# Patient Record
Sex: Female | Born: 1985 | Race: White | Hispanic: No | Marital: Married | Smoking: Former smoker
Health system: Southern US, Community
[De-identification: ages and names within clinical notes are randomized; demographics above are authoritative.]

## PROBLEM LIST (undated history)

## (undated) DIAGNOSIS — K589 Irritable bowel syndrome without diarrhea: Secondary | ICD-10-CM

## (undated) DIAGNOSIS — E669 Obesity, unspecified: Secondary | ICD-10-CM

## (undated) DIAGNOSIS — K219 Gastro-esophageal reflux disease without esophagitis: Secondary | ICD-10-CM

## (undated) DIAGNOSIS — A048 Other specified bacterial intestinal infections: Secondary | ICD-10-CM

## (undated) DIAGNOSIS — I10 Essential (primary) hypertension: Secondary | ICD-10-CM

## (undated) DIAGNOSIS — N200 Calculus of kidney: Secondary | ICD-10-CM

## (undated) DIAGNOSIS — R51 Headache: Secondary | ICD-10-CM

## (undated) DIAGNOSIS — F419 Anxiety disorder, unspecified: Secondary | ICD-10-CM

## (undated) DIAGNOSIS — Z8489 Family history of other specified conditions: Secondary | ICD-10-CM

## (undated) DIAGNOSIS — T07XXXA Unspecified multiple injuries, initial encounter: Secondary | ICD-10-CM

## (undated) DIAGNOSIS — M797 Fibromyalgia: Secondary | ICD-10-CM

## (undated) DIAGNOSIS — R55 Syncope and collapse: Secondary | ICD-10-CM

## (undated) DIAGNOSIS — R519 Headache, unspecified: Secondary | ICD-10-CM

## (undated) DIAGNOSIS — E039 Hypothyroidism, unspecified: Secondary | ICD-10-CM

## (undated) DIAGNOSIS — R112 Nausea with vomiting, unspecified: Secondary | ICD-10-CM

## (undated) DIAGNOSIS — Z9289 Personal history of other medical treatment: Secondary | ICD-10-CM

## (undated) DIAGNOSIS — T148XXA Other injury of unspecified body region, initial encounter: Secondary | ICD-10-CM

## (undated) DIAGNOSIS — G8929 Other chronic pain: Secondary | ICD-10-CM

## (undated) DIAGNOSIS — K2210 Ulcer of esophagus without bleeding: Secondary | ICD-10-CM

## (undated) DIAGNOSIS — M1990 Unspecified osteoarthritis, unspecified site: Secondary | ICD-10-CM

## (undated) DIAGNOSIS — Z9889 Other specified postprocedural states: Secondary | ICD-10-CM

## (undated) HISTORY — DX: Anxiety disorder, unspecified: F41.9

## (undated) HISTORY — DX: Headache: R51

## (undated) HISTORY — PX: TONSILLECTOMY: SUR1361

## (undated) HISTORY — DX: Ulcer of esophagus without bleeding: K22.10

## (undated) HISTORY — DX: Essential (primary) hypertension: I10

## (undated) HISTORY — DX: Other specified bacterial intestinal infections: A04.8

## (undated) HISTORY — DX: Fibromyalgia: M79.7

## (undated) HISTORY — DX: Hypothyroidism, unspecified: E03.9

## (undated) HISTORY — DX: Calculus of kidney: N20.0

## (undated) HISTORY — DX: Headache, unspecified: R51.9

## (undated) HISTORY — DX: Obesity, unspecified: E66.9

## (undated) HISTORY — DX: Gastro-esophageal reflux disease without esophagitis: K21.9

## (undated) HISTORY — DX: Unspecified osteoarthritis, unspecified site: M19.90

## (undated) HISTORY — DX: Irritable bowel syndrome, unspecified: K58.9

## (undated) HISTORY — DX: Other chronic pain: G89.29

---

## 2011-05-18 ENCOUNTER — Encounter: Payer: Self-pay | Admitting: Gastroenterology

## 2011-06-06 ENCOUNTER — Other Ambulatory Visit (INDEPENDENT_AMBULATORY_CARE_PROVIDER_SITE_OTHER): Payer: Medicaid Other

## 2011-06-06 ENCOUNTER — Encounter: Payer: Self-pay | Admitting: Gastroenterology

## 2011-06-06 ENCOUNTER — Ambulatory Visit (INDEPENDENT_AMBULATORY_CARE_PROVIDER_SITE_OTHER): Payer: Medicaid Other | Admitting: Gastroenterology

## 2011-06-06 VITALS — BP 126/86 | HR 96 | Ht 67.0 in | Wt 299.0 lb

## 2011-06-06 DIAGNOSIS — K589 Irritable bowel syndrome without diarrhea: Secondary | ICD-10-CM

## 2011-06-06 DIAGNOSIS — R1012 Left upper quadrant pain: Secondary | ICD-10-CM

## 2011-06-06 LAB — HEPATIC FUNCTION PANEL
Alkaline Phosphatase: 63 U/L (ref 39–117)
Bilirubin, Direct: 0 mg/dL (ref 0.0–0.3)
Total Bilirubin: 0.3 mg/dL (ref 0.3–1.2)
Total Protein: 7.1 g/dL (ref 6.0–8.3)

## 2011-06-06 LAB — CBC WITH DIFFERENTIAL/PLATELET
Basophils Relative: 0.4 % (ref 0.0–3.0)
Eosinophils Absolute: 0.1 10*3/uL (ref 0.0–0.7)
Lymphocytes Relative: 24.5 % (ref 12.0–46.0)
MCHC: 34.3 g/dL (ref 30.0–36.0)
MCV: 90.7 fl (ref 78.0–100.0)
Monocytes Absolute: 0.3 10*3/uL (ref 0.1–1.0)
Neutrophils Relative %: 69 % (ref 43.0–77.0)
Platelets: 241 10*3/uL (ref 150.0–400.0)
RBC: 4.34 Mil/uL (ref 3.87–5.11)
WBC: 5.7 10*3/uL (ref 4.5–10.5)

## 2011-06-06 MED ORDER — GLYCOPYRROLATE 2 MG PO TABS: 2.0000 mg | ORAL_TABLET | Freq: Two times a day (BID) | ORAL | Status: DC

## 2011-06-06 NOTE — Progress Notes (Signed)
History of Present Illness: This is a 25 year old female here today with her mother who has multiple complaints. She relates a history of GERD with "ulcers in her esophagus" diagnosed 3 years ago in [place]. She's been taking ranitidine 150 mg twice a day to 300 mg twice a day for the past few years to control her symptoms. Recently her reflux symptoms, mainly described as midsternal chest burning, worsened and she was switched to Nexium which has improved control of her symptoms. She was found to have an elevated H. pylori antibody last month and she was treated with Prevpac. She moves to this area from [place] in August and since that time she has had problems with recurrent left-sided abdominal pain associated with urgent loose nonbloody bowel movements. Denies weight loss, constipation,change in stool caliber, melena, hematochezia, nausea, vomiting, dysphagia.  Past Medical History  Diagnosis Date  . Anxiety   . Gout   . GERD (gastroesophageal reflux disease)   . H. pylori infection   . IBS (irritable bowel syndrome)   . Arthritis   . Chronic headaches   . Gastric ulcer   . HTN (hypertension)   . Hypothyroidism   . Kidney stones   . Obesity    Past Surgical History  Procedure Date  . Cesarean section   . Tonsillectomy     reports that she has been smoking Cigarettes.  She has been smoking about .3 packs per day. She has never used smokeless tobacco. She reports that she drinks alcohol. She reports that she does not use illicit drugs. family history includes Brain cancer (age of onset:50) in her maternal aunt; COPD in her maternal uncle; Cancer in an unspecified family member; Crohn's disease in her cousin; Dementia in her paternal grandmother; Diverticulosis in her mother; Emphysema in her maternal grandfather and maternal uncle; Heart disease in her maternal grandmother; Irritable bowel syndrome in her cousin; Thyroid disease in an unspecified family member; and Ulcerative colitis in her  cousin. No Known Allergies  Outpatient Encounter Prescriptions as of 06/06/2011  Medication Sig Dispense Refill  . ALPRAZolam (XANAX) 0.25 MG tablet Take 0.25 mg by mouth daily.        Marland Kitchen esomeprazole (NEXIUM) 40 MG capsule Take 40 mg by mouth daily before breakfast.        . etodolac (LODINE) 400 MG tablet Take 400 mg by mouth 2 (two) times daily.        . febuxostat (ULORIC) 40 MG tablet Take 80 mg by mouth daily.        Marland Kitchen levothyroxine (SYNTHROID, LEVOTHROID) 100 MCG tablet Take 100 mcg by mouth daily.        Kathrynn Running Estrad-Fe Biphas (LO LOESTRIN FE PO) Take 1 tablet by mouth daily.        . phentermine (ADIPEX-P) 37.5 MG tablet Take 37.5 mg by mouth daily before breakfast.        . triamterene-hydrochlorothiazide (DYAZIDE) 37.5-25 MG per capsule Take 1 capsule by mouth every morning.        Marland Kitchen glycopyrrolate (ROBINUL-FORTE) 2 MG tablet Take 1 tablet (2 mg total) by mouth 2 (two) times daily before lunch and supper.  60 tablet  11   Review of Systems: Pertinent positive and negative review of systems were noted in the above HPI section. All other review of systems were otherwise negative.   Physical Exam: General: Well developed , well nourished, no acute distress Head: Normocephalic and atraumatic Eyes:  sclerae anicteric, EOMI Ears: Normal auditory acuity Mouth: No  deformity or lesions Neck: Supple, no masses or thyromegaly Lungs: Clear throughout to auscultation Heart: Regular rate and rhythm; no murmurs, rubs or bruits Abdomen: Soft, mild to moderate left-sided abdominal tenderness without rebound or guarding and non distended. No masses, hepatosplenomegaly or hernias noted. Normal Bowel sounds Musculoskeletal: Symmetrical with no gross deformities  Skin: No lesions on visible extremities Pulses:  Normal pulses noted Extremities: No clubbing, cyanosis, edema or deformities noted Neurological: Alert oriented x 4, grossly nonfocal Cervical Nodes:  No significant cervical  adenopathy Inguinal Nodes: No significant inguinal adenopathy Psychological:  Alert and cooperative. Very anxious.  Assessment and Recommendations:  1. GERD with a history of esophagitis. Standard antireflux measures and a daily PPI. Continue Nexium 40 mg daily for now. If her symptoms are not well controlled consider endoscopy.  2. Left-sided abdominal pain and urgent diarrhea. Presumed irritable bowel syndrome. Begin glycopyrrolate 2 mg twice daily and standard dietary instructions for irritable bowel syndrome. Her anxiety is likely triggering irritable bowel and this needs to be further addressed by her primary physician. Minimize or avoid ASA, NSAIDs, cox-2 agents. Proceed with a CT scan of the abdomen and pelvis to rule out other disorders leading to abdominal pain. Obtain stool Hemoccults. Standard blood work. Consider colonoscopy and upper endoscopy symptoms do not respond.

## 2011-06-06 NOTE — Patient Instructions (Addendum)
You have been given a separate informational sheet regarding your tobacco use, the importance of quitting and local resources to help you quit.  Go directly to the basement to have your labs drawn today.  You have been scheduled for a CT scan of the abdomen and pelvis at [facility] ([address]---this is in the same building as Architectural [facility]).   You are scheduled on 06/12/11 at 1:00pm. You should arrive 15 minutes prior to your appointment time for registration. Please follow the written instructions below on the day of your exam:  WARNING: IF YOU ARE ALLERGIC TO IODINE/X-RAY DYE, PLEASE NOTIFY RADIOLOGY IMMEDIATELY AT [phone]! YOU WILL BE GIVEN A 13 HOUR PREMEDICATION PREP.  1) Do not eat or drink anything after 9:00am (4 hours prior to your test) 2) You have been given 2 bottles of oral contrast to drink. The solution may taste better if refrigerated, but do NOT add ice or any other liquid to this solution. Shake well before drinking.    Drink 1 bottle of contrast @ 11:00am (2 hours prior to your exam)  Drink 1 bottle of contrast @ 12:00pm (1 hour prior to your exam)  You may take any medications as prescribed with a small amount of water except for the following: Metformin, Glucophage, Glucovance, Avandamet, Riomet, Fortamet, Actoplus Met, Janumet, Glumetza or Metaglip. The above medications must be held the day of the exam AND 48 hours after the exam.  The purpose of you drinking the oral contrast is to aid in the visualization of your intestinal tract. The contrast solution may cause some diarrhea. Before your exam is started, you will be given a small amount of fluid to drink. Depending on your individual set of symptoms, you may also receive an intravenous injection of x-ray contrast/dye. Plan on being at [facility] for 30 minutes or long, depending on the type of exam you are having performed.  If you have any questions regarding your exam or if you  need to reschedule, you may call the CT department at [phone] between the hours of 8:00 am and 5:00 pm, Monday-Friday.  ________________________________________________________________________  Follow instructions on Hemoccult cards and mail back to [place] when finished.  Your prescription for Robinul forte has been sent to your pharmacy.  IBS brochure given. Patient advised to avoid spicy, acidic, citrus, chocolate, mints, fruit and fruit juices.  Limit the intake of caffeine, alcohol and Soda.  Don't exercise too soon after eating.  Don't lie down within 3-4 hours of eating.  Elevate the head of your bed.  cc: Rudi Heap, MD

## 2011-06-12 ENCOUNTER — Inpatient Hospital Stay: Admission: RE | Admit: 2011-06-12 | Payer: Medicaid Other | Source: Ambulatory Visit

## 2011-06-13 ENCOUNTER — Telehealth: Payer: Self-pay

## 2011-06-13 NOTE — Telephone Encounter (Signed)
Called patient to notify her that her CT scan was not approved by [employer]. Per Dr. Ardell Isaacs last office note the next step would be to consider scheduling Endoscopy/ Colonoscopy. Spoke with Dr. Russella Dar and he states if her symptoms are no better since the last office visit and taking the anti-spasmodic then to proceed with Endo/ Colon with propofol. Spoke with patient and she states the glycopyrrolate has not helped with her abdominal pain or diarrhea. Her symptoms are the same if not worse and would like to schedule both procedures. Scheduled patient for ECL on 07/23/10 at 9:30am and previsit on 07/17/10 at 10:00am. Told patient that if she wants to call back in a week there could be opening slot sooner for her procedures due to cancellations.  Pt agreed and verbalized understanding.

## 2011-06-15 ENCOUNTER — Other Ambulatory Visit: Payer: Medicaid Other

## 2011-06-18 ENCOUNTER — Telehealth: Payer: Self-pay | Admitting: Gastroenterology

## 2011-06-18 NOTE — Telephone Encounter (Signed)
Patient advised that she can call and look for earlier openings.  She is upset that we won't do CT scan.  I have explained again that we are more than happy to have her do CT, but her insurance denied it.  She is advised that we can reschedule CT scan but she will be responsible for the bill.  She does not want to reschedule.  She will continue to call and check for cancellations

## 2011-06-21 ENCOUNTER — Other Ambulatory Visit: Payer: Medicaid Other

## 2011-06-21 ENCOUNTER — Other Ambulatory Visit: Payer: Self-pay | Admitting: Gastroenterology

## 2011-06-21 DIAGNOSIS — Z1211 Encounter for screening for malignant neoplasm of colon: Secondary | ICD-10-CM

## 2011-06-21 LAB — HEMOCCULT SLIDES (X 3 CARDS)
Fecal Occult Blood: NEGATIVE
OCCULT 1: NEGATIVE
OCCULT 2: NEGATIVE
OCCULT 3: NEGATIVE
OCCULT 4: NEGATIVE
OCCULT 5: NEGATIVE

## 2011-06-29 ENCOUNTER — Encounter: Payer: Self-pay | Admitting: Gastroenterology

## 2011-06-29 NOTE — Telephone Encounter (Signed)
Error

## 2011-07-18 ENCOUNTER — Ambulatory Visit (AMBULATORY_SURGERY_CENTER): Payer: Medicaid Other | Admitting: *Deleted

## 2011-07-18 VITALS — Ht 67.0 in | Wt 281.4 lb

## 2011-07-18 DIAGNOSIS — K589 Irritable bowel syndrome without diarrhea: Secondary | ICD-10-CM

## 2011-07-18 DIAGNOSIS — R1012 Left upper quadrant pain: Secondary | ICD-10-CM

## 2011-07-18 MED ORDER — PEG-KCL-NACL-NASULF-NA ASC-C 100 G PO SOLR: ORAL | Status: DC

## 2011-07-24 ENCOUNTER — Ambulatory Visit (AMBULATORY_SURGERY_CENTER): Payer: Medicaid Other | Admitting: Gastroenterology

## 2011-07-24 ENCOUNTER — Encounter: Payer: Self-pay | Admitting: Gastroenterology

## 2011-07-24 DIAGNOSIS — K589 Irritable bowel syndrome without diarrhea: Secondary | ICD-10-CM

## 2011-07-24 DIAGNOSIS — R197 Diarrhea, unspecified: Secondary | ICD-10-CM

## 2011-07-24 DIAGNOSIS — R1012 Left upper quadrant pain: Secondary | ICD-10-CM

## 2011-07-24 DIAGNOSIS — K2980 Duodenitis without bleeding: Secondary | ICD-10-CM

## 2011-07-24 DIAGNOSIS — K219 Gastro-esophageal reflux disease without esophagitis: Secondary | ICD-10-CM

## 2011-07-24 MED ORDER — SODIUM CHLORIDE 0.9 % IV SOLN: 500.0000 mL | INTRAVENOUS | Status: DC

## 2011-07-24 MED ORDER — ESOMEPRAZOLE MAGNESIUM 40 MG PO CPDR: 40.0000 mg | DELAYED_RELEASE_CAPSULE | Freq: Two times a day (BID) | ORAL | Status: DC

## 2011-07-24 NOTE — Op Note (Signed)
[facility] [address] [place], [place]  [address]  COLONOSCOPY PROCEDURE REPORT  PATIENT:  Tasha, Hays  MR#:  [id number] BIRTHDATE:  [birth year], 25 yrs. old  GENDER:  female ENDOSCOPIST:  Judie Petit T. Russella Dar, MD, [facility] Referred by:  Rudi Heap, M.D. PROCEDURE DATE:  07/24/2011 PROCEDURE:  Colonoscopy with biopsy ASA CLASS:  Class II INDICATIONS:  1) unexplained diarrhea  2) abdominal pain MEDICATIONS:   MAC sedation, administered by CRNA, propofol (Diprivan) 500 mg IV DESCRIPTION OF PROCEDURE:   After the risks benefits and alternatives of the procedure were thoroughly explained, informed consent was obtained.  Digital rectal exam was performed and revealed no abnormalities.   The [id number] endoscope was introduced through the anus and advanced to the terminal ileum which was intubated for a short distance, without limitations. The quality of the prep was good, using MoviPrep.  The instrument was then slowly withdrawn as the colon was fully examined. <<PROCEDUREIMAGES>> FINDINGS:  Scattered diverticula were found in the transverse colon.  The terminal ileum appeared normal.  Otherwise normal colonoscopy without other polyps, masses, vascular ectasias, or inflammatory changes. Random biopsies were obtained and sent to pathology.   Retroflexed views in the rectum revealed no abnormalities.  The time to cecum =  3  minutes. The scope was then withdrawn (time =  11.25  min) from the patient and the procedure completed.  COMPLICATIONS:  None  ENDOSCOPIC IMPRESSION: 1) Diverticula, scattered in the transverse colon 2) Normal terminal ileum  RECOMMENDATIONS: 1) Await pathology results 2) Continue treatment for IBS with probiotics and antispasmotics   [name] T. Russella Dar, MD, [facility]  n. eSIGNED:   Venita Lick. Shelitha Magley at 07/24/2011 09:51 AM  Makepeace, Colin Mulders, [phone]

## 2011-07-24 NOTE — Progress Notes (Signed)
Patient Has body piercing to clitoris area. Unable to remove. Dr. Russella Dar notified of piercing.

## 2011-07-24 NOTE — Progress Notes (Signed)
Patient did not experience any of the following events: a burn prior to discharge; a fall within the facility; wrong site/side/patient/procedure/implant event; or a hospital transfer or hospital admission upon discharge from the facility. (G8907) Patient did not have preoperative order for IV antibiotic SSI prophylaxis. (G8918)  

## 2011-07-24 NOTE — Patient Instructions (Signed)
Esophagitis Esophagitis (heartburn) is a painful, burning sensation in the chest. It may feel worse in certain positions, such as lying down or bending over. It is caused by stomach acid backing up into the tube that carries food from the mouth down to the stomach (lower esophagus). TREATMENT  There are a number of non-prescription medicines used to treat heartburn, including:  Antacids.   Acid reducers (also called H-2 blockers).   Proton-pump inhibitors.  HOME CARE INSTRUCTIONS   Raise the head of your bead by putting blocks under the legs.   Eat 2-3 hours before going to bed.   Stop smoking.   Try to reach and maintain a healthy weight.   Do not eat just a few very large meals. Instead, eat many smaller meals throughout the day.   Try to identify foods and beverages that make your symptoms worse, and avoid these.   Avoid tight clothing.   Do not exercise right after eating.  SEEK IMMEDIATE MEDICAL CARE IF:  You have severe chest pain that goes down your arm, or into your jaw or neck.   You feel sweaty, dizzy, or lightheaded.   You are short of breath.   You throw up (vomit) blood.   You have difficulty or pain with swallowing.   You have bloody or black, tarry stools.   You have bouts of heartburn more than three times a week for more than two weeks.  Document Released: 08/09/2004 Document Revised: 01/15/2011 Document Reviewed: 11/10/2008 [facility] Patient Information 2012 [place], [place].  Diverticulosis Diverticulosis is a common condition that develops when small pouches (diverticula) form in the wall of the colon. The risk of diverticulosis increases with age. It happens more often in people who eat a low-fiber diet. Most individuals with diverticulosis have no symptoms. Those individuals with symptoms usually experience abdominal pain, constipation, or loose stools (diarrhea). HOME CARE INSTRUCTIONS   Increase the amount of fiber in your diet as directed by  your caregiver or dietician. This may reduce symptoms of diverticulosis.   Your caregiver may recommend taking a dietary fiber supplement.   Drink at least 6 to 8 glasses of water each day to prevent constipation.   Try not to strain when you have a bowel movement.   Your caregiver may recommend avoiding nuts and seeds to prevent complications, although this is still an uncertain benefit.   Only take over-the-counter or prescription medicines for pain, discomfort, or fever as directed by your caregiver.  FOODS WITH HIGH FIBER CONTENT INCLUDE:  Fruits. Apple, peach, pear, tangerine, raisins, prunes.   Vegetables. Brussels sprouts, asparagus, broccoli, cabbage, carrot, cauliflower, romaine lettuce, spinach, summer squash, tomato, winter squash, zucchini.   Starchy Vegetables. Baked beans, kidney beans, lima beans, split peas, lentils, potatoes (with skin).   Grains. Whole wheat bread, brown rice, bran flake cereal, plain oatmeal, white rice, shredded wheat, bran muffins.  SEEK IMMEDIATE MEDICAL CARE IF:   You develop increasing pain or severe bloating.   You have an oral temperature above 102 F (38.9 C), not controlled by medicine.   You develop vomiting or bowel movements that are bloody or black.  Document Released: 03/29/2004 Document Revised: 03/14/2011 Document Reviewed: 11/30/2009 [facility] Patient Information 2012 [place], [place].

## 2011-07-24 NOTE — Op Note (Signed)
[facility] [address] [place], [place]  [address]  ENDOSCOPY PROCEDURE REPORT PATIENT:  Tasha, Hays  MR#:  [id number] BIRTHDATE:  August 20, 1985, 25 yrs. old  GENDER:  female ENDOSCOPIST:  Judie Petit T. Russella Dar, MD, [facility] Referred by:  Rudi Heap, M.D. PROCEDURE DATE:  07/24/2011 PROCEDURE:  EGD with biopsy, [address] ASA CLASS:  Class II INDICATIONS:  GERD, abdominal pain, left upper quadr. diarrhea MEDICATIONS:   MAC sedation, administered by CRNA, There was residual sedation effect present from prior procedure., propofol (Diprivan) 250 mg IV TOPICAL ANESTHETIC:  none DESCRIPTION OF PROCEDURE:   After the risks benefits and alternatives of the procedure were thoroughly explained, informed consent was obtained.  The [facility] [id number] endoscope was introduced through the mouth and advanced to the second portion of the duodenum, without limitations.  The instrument was slowly withdrawn as the mucosa was fully examined. <<PROCEDUREIMAGES>> Esophagitis was found in the distal esophagus. It was erosive and linear arrayed.  LA Class Grade A. Otherwise normal esophagus. The stomach was entered and closely examined. The pylorus, antrum, angularis, and lesser curvature were well visualized, including a retroflexed view of the cardia and fundus. The stomach wall was normally distensable. The scope passed easily through the pylorus into the duodenum. The duodenal bulb was normal in appearance, as was the postbulbar duodenum. Random biopsies obtained in the duodenum to R/O celiac disease. Retroflexed views revealed no abnormalities.  The scope was then withdrawn from the patient and the procedure completed.  COMPLICATIONS:  None  ENDOSCOPIC IMPRESSION: 1) Erosive esophagitis  RECOMMENDATIONS: 1) Anti-reflux regimen 2) Await pathology results 3) PPI bid: Nexium 40 mg po bid, #60, 11 refills  Mikelle Myrick T. Russella Dar, MD, [facility]  n. eSIGNED:   Venita Lick. Fahad Cisse at 07/24/2011 10:01  AM  [place], Colin Mulders, [phone]

## 2011-07-25 ENCOUNTER — Telehealth: Payer: Self-pay | Admitting: *Deleted

## 2011-07-25 NOTE — Telephone Encounter (Signed)
Left message

## 2011-07-30 ENCOUNTER — Encounter: Payer: Self-pay | Admitting: Gastroenterology

## 2011-08-01 ENCOUNTER — Telehealth: Payer: Self-pay | Admitting: Gastroenterology

## 2011-08-01 NOTE — Telephone Encounter (Signed)
I have reviewed the path results and letter with the patient

## 2011-08-22 ENCOUNTER — Telehealth: Payer: Self-pay | Admitting: Gastroenterology

## 2011-08-22 NOTE — Telephone Encounter (Signed)
Patient is calling and c/o continued abdominal pain not helped by dicyclomine or robinul.  She is scheduled for a follow up appt on 09/10/11.  I have asked her to continue her medications and call back for worsening pain

## 2011-09-10 ENCOUNTER — Ambulatory Visit (INDEPENDENT_AMBULATORY_CARE_PROVIDER_SITE_OTHER): Payer: Medicaid Other | Admitting: Gastroenterology

## 2011-09-10 ENCOUNTER — Encounter: Payer: Self-pay | Admitting: Gastroenterology

## 2011-09-10 VITALS — BP 122/68 | HR 94 | Ht 66.0 in | Wt 282.0 lb

## 2011-09-10 DIAGNOSIS — K589 Irritable bowel syndrome without diarrhea: Secondary | ICD-10-CM

## 2011-09-10 DIAGNOSIS — R079 Chest pain, unspecified: Secondary | ICD-10-CM

## 2011-09-10 MED ORDER — HYOSCYAMINE SULFATE ER 0.375 MG PO TB12: 375.0000 ug | ORAL_TABLET | Freq: Two times a day (BID) | ORAL | Status: DC

## 2011-09-10 MED ORDER — RESTORA PO CAPS: 1.0000 | ORAL_CAPSULE | Freq: Every day | ORAL | Status: DC

## 2011-09-10 MED ORDER — ALIGN 4 MG PO CAPS: 1.0000 | ORAL_CAPSULE | Freq: Every day | ORAL | Status: DC

## 2011-09-10 NOTE — Progress Notes (Signed)
History of Present Illness: This is a 26 year old female returns today following colonoscopy and upper endoscopy performed in January 2013. Colonoscopy was entirely normal. The upper endoscopy revealed erosive esophagitis. Her reflux symptoms have come under better control on Nexium 40 mg twice a day. She has persistent burning and cramping generalized abdominal pain associated with alternating diarrhea and constipation. Robinul and Bentyl have not been effective in controlling her symptoms however she feels that Xanax has helped and that her irritable bowel seems to be triggered by stress and anxiety. She describes stabbing-like chest pains in the center of her chest and along both sides of her chest and under her breasts. The symptoms occur in an unpredictable fashion not related to exercise, meals or movement.  Current Medications, Allergies, Past Medical History, Past Surgical History, Family History and Social History were reviewed in [employer] record.  Physical Exam: General: Well developed , well nourished, no acute distress Head: Normocephalic and atraumatic Eyes:  sclerae anicteric, EOMI Ears: Normal auditory acuity Mouth: No deformity or lesions Lungs: Clear throughout to auscultation Heart: Regular rate and rhythm; no murmurs, rubs or bruits Abdomen: Soft, mild generalized tenderness to light palpation and non distended. No masses, hepatosplenomegaly or hernias noted. Normal Bowel sounds Musculoskeletal: Symmetrical with no gross deformities  Pulses:  Normal pulses noted Extremities: No clubbing, cyanosis, edema or deformities noted Neurological: Alert oriented x 4, grossly nonfocal Psychological:  Alert and cooperative. Normal mood and affect  Assessment and Recommendations:  1. Chest pain. This does not GI related and she will follow up with her PCP for further evaluation.  2. GERD/EE. Continue Nexium BID and antireflux measures.  3. IBS with alternating  bowel habits. Change to LevBid and begin a trial of probiotics. She is advised to try several different probiotics to see if one is more effective than other. The majority of her irritable bowel symptoms are driven by stress and anxiety and she will need help with control of stress and anxiety by her primary physician for her irritable bowel syndrome to be well controlled. Her to return for PCP to consider SSRI therapy, Xanax and/or other therapies.

## 2011-09-10 NOTE — Patient Instructions (Signed)
We have given you samples of Align and Restora. This puts good bacteria back into your intestines. You should take 1 capsule by mouth once daily. If this works well for you, it can be purchased over the counter. Discontinue Bentyl and start Levbid one tablet by mouth twice daily.  A prescription has been sent to your pharmacy. You have been given an IBS brochure. cc: Rudi Heap MD

## 2011-09-18 ENCOUNTER — Telehealth: Payer: Self-pay | Admitting: Gastroenterology

## 2011-09-18 NOTE — Telephone Encounter (Signed)
Patient was seen by her primary care Bennie Pierini, PA with [facility].  She changed her to dexilant for her CP and now she has terrible abdominal pain and "intestines burning".  She was not able to afford the levbid and had to go back on bentyl.  She says the burning started after changing from Nexium to Dexilant.  She is advised to hold dexilant and see if the abdominal pain stops, if it does return to Nexium BID as Dr Russella Dar had ordered.  She was told that her CP is all from GERD.  I did review with her Dr Ardell Isaacs note and that he did not feel her CP was GI related.  She will try the above suggestions and return to her primary care to discuss stress management as listed in Dr Ardell Isaacs note from 09/10/11    She will call back for any questions.

## 2011-09-18 NOTE — Telephone Encounter (Signed)
Agree 

## 2012-01-11 ENCOUNTER — Encounter (HOSPITAL_COMMUNITY): Payer: Self-pay | Admitting: Pharmacy Technician

## 2012-01-15 ENCOUNTER — Encounter (HOSPITAL_COMMUNITY)
Admission: RE | Admit: 2012-01-15 | Discharge: 2012-01-15 | Disposition: A | Discharge status: Home / Self Care | Payer: Medicaid Other | Source: Ambulatory Visit | Attending: Orthopedic Surgery | Admitting: Orthopedic Surgery

## 2012-01-15 ENCOUNTER — Ambulatory Visit (HOSPITAL_COMMUNITY)
Admission: RE | Admit: 2012-01-15 | Discharge: 2012-01-15 | Disposition: A | Discharge status: Home / Self Care | Payer: Medicaid Other | Source: Ambulatory Visit | Attending: Orthopedic Surgery | Admitting: Orthopedic Surgery

## 2012-01-15 ENCOUNTER — Encounter (HOSPITAL_COMMUNITY): Payer: Self-pay

## 2012-01-15 ENCOUNTER — Ambulatory Visit (HOSPITAL_COMMUNITY)
Admission: RE | Admit: 2012-01-15 | Discharge: 2012-01-15 | Disposition: A | Discharge status: Home / Self Care | Payer: Medicaid Other | Source: Ambulatory Visit | Attending: Surgical | Admitting: Surgical

## 2012-01-15 DIAGNOSIS — M79609 Pain in unspecified limb: Secondary | ICD-10-CM | POA: Insufficient documentation

## 2012-01-15 DIAGNOSIS — N200 Calculus of kidney: Secondary | ICD-10-CM | POA: Insufficient documentation

## 2012-01-15 DIAGNOSIS — Z9289 Personal history of other medical treatment: Secondary | ICD-10-CM

## 2012-01-15 DIAGNOSIS — M5126 Other intervertebral disc displacement, lumbar region: Secondary | ICD-10-CM | POA: Insufficient documentation

## 2012-01-15 DIAGNOSIS — Z8489 Family history of other specified conditions: Secondary | ICD-10-CM

## 2012-01-15 DIAGNOSIS — IMO0001 Reserved for inherently not codable concepts without codable children: Secondary | ICD-10-CM

## 2012-01-15 DIAGNOSIS — T07XXXA Unspecified multiple injuries, initial encounter: Secondary | ICD-10-CM

## 2012-01-15 DIAGNOSIS — M545 Low back pain, unspecified: Secondary | ICD-10-CM | POA: Insufficient documentation

## 2012-01-15 DIAGNOSIS — T148XXA Other injury of unspecified body region, initial encounter: Secondary | ICD-10-CM

## 2012-01-15 DIAGNOSIS — M1990 Unspecified osteoarthritis, unspecified site: Secondary | ICD-10-CM

## 2012-01-15 DIAGNOSIS — R55 Syncope and collapse: Secondary | ICD-10-CM

## 2012-01-15 DIAGNOSIS — R209 Unspecified disturbances of skin sensation: Secondary | ICD-10-CM | POA: Insufficient documentation

## 2012-01-15 DIAGNOSIS — Z01818 Encounter for other preprocedural examination: Secondary | ICD-10-CM | POA: Insufficient documentation

## 2012-01-15 DIAGNOSIS — Z01812 Encounter for preprocedural laboratory examination: Secondary | ICD-10-CM | POA: Insufficient documentation

## 2012-01-15 HISTORY — DX: Other injury of unspecified body region, initial encounter: T14.8XXA

## 2012-01-15 HISTORY — DX: Family history of other specified conditions: Z84.89

## 2012-01-15 HISTORY — DX: Unspecified osteoarthritis, unspecified site: M19.90

## 2012-01-15 HISTORY — DX: Syncope and collapse: R55

## 2012-01-15 HISTORY — DX: Personal history of other medical treatment: Z92.89

## 2012-01-15 HISTORY — DX: Reserved for inherently not codable concepts without codable children: IMO0001

## 2012-01-15 HISTORY — DX: Unspecified multiple injuries, initial encounter: T07.XXXA

## 2012-01-15 LAB — URINE MICROSCOPIC-ADD ON

## 2012-01-15 LAB — DIFFERENTIAL
Basophils Absolute: 0 10*3/uL (ref 0.0–0.1)
Basophils Relative: 0 % (ref 0–1)
Eosinophils Absolute: 0.3 10*3/uL (ref 0.0–0.7)
Eosinophils Relative: 4 % (ref 0–5)
Lymphocytes Relative: 29 % (ref 12–46)
Lymphs Abs: 2.1 10*3/uL (ref 0.7–4.0)
Monocytes Absolute: 0.3 10*3/uL (ref 0.1–1.0)
Monocytes Relative: 4 % (ref 3–12)
Neutro Abs: 4.4 10*3/uL (ref 1.7–7.7)
Neutrophils Relative %: 63 % (ref 43–77)

## 2012-01-15 LAB — URINALYSIS, ROUTINE W REFLEX MICROSCOPIC
Glucose, UA: NEGATIVE mg/dL
Ketones, ur: NEGATIVE mg/dL
Nitrite: NEGATIVE
Protein, ur: NEGATIVE mg/dL
Specific Gravity, Urine: 1.031 — ABNORMAL HIGH (ref 1.005–1.030)
Urobilinogen, UA: 0.2 mg/dL (ref 0.0–1.0)
pH: 6 (ref 5.0–8.0)

## 2012-01-15 LAB — COMPREHENSIVE METABOLIC PANEL
ALT: 16 U/L (ref 0–35)
AST: 15 U/L (ref 0–37)
Albumin: 3.8 g/dL (ref 3.5–5.2)
Alkaline Phosphatase: 66 U/L (ref 39–117)
BUN: 9 mg/dL (ref 6–23)
CO2: 25 mEq/L (ref 19–32)
Calcium: 9.2 mg/dL (ref 8.4–10.5)
Chloride: 101 mEq/L (ref 96–112)
Creatinine, Ser: 0.84 mg/dL (ref 0.50–1.10)
GFR calc Af Amer: >90 mL/min (ref >90)
GFR calc non Af Amer: >90 mL/min (ref >90)
Glucose, Bld: 88 mg/dL (ref 70–99)
Potassium: 3.8 mEq/L (ref 3.5–5.1)
Sodium: 135 mEq/L (ref 135–145)
Total Bilirubin: 0.2 mg/dL — ABNORMAL LOW (ref 0.3–1.2)
Total Protein: 7.5 g/dL (ref 6.0–8.3)

## 2012-01-15 LAB — CBC
HCT: 42.3 % (ref 36.0–46.0)
Hemoglobin: 14.9 g/dL (ref 12.0–15.0)
MCH: 31.2 pg (ref 26.0–34.0)
MCHC: 35.2 g/dL (ref 30.0–36.0)
MCV: 88.7 fL (ref 78.0–100.0)
Platelets: 341 10*3/uL (ref 150–400)
RBC: 4.77 MIL/uL (ref 3.87–5.11)
RDW: 12.9 % (ref 11.5–15.5)
WBC: 7.1 10*3/uL (ref 4.0–10.5)

## 2012-01-15 LAB — SURGICAL PCR SCREEN
MRSA, PCR: NEGATIVE
Staphylococcus aureus: NEGATIVE

## 2012-01-15 LAB — PROTIME-INR
INR: 0.99 (ref 0.00–1.49)
Prothrombin Time: 13.3 seconds (ref 11.6–15.2)

## 2012-01-15 LAB — APTT: aPTT: 28 seconds (ref 24–37)

## 2012-01-15 NOTE — Pre-Procedure Instructions (Signed)
01-15-12 Lumbar spine XR done. EKG(09-12-11) / CXR done today.

## 2012-01-15 NOTE — Patient Instructions (Addendum)
20 Tasha Hays  01/15/2012   Your procedure is scheduled on:  7-11 -2013  Report to [facility] at      1000   AM .  Call this number if you have problems the morning of surgery: [phone]   Remember:   Do not eat food:After Midnight.  May have clear liquids:up to 6 Hours before arrival. Nothing after : 0600 AM  Clear liquids include soda, tea, black coffee, apple or grape juice, broth.  Take these medicines the morning of surgery with A SIP OF WATER: Nexium, Clorazepate, Levothyroxine, Dicyclomine, Fioricet, Birth control   Do not wear jewelry, make-up or nail polish.  Do not wear lotions, powders, or perfumes. You may wear deodorant.  Do not shave 48 hours prior to surgery.(face and neck okay, no shaving of legs)  Do not bring valuables to the hospital.  Contacts, dentures or bridgework may not be worn into surgery.  Leave suitcase in the car. After surgery it may be brought to your room.  For patients admitted to the hospital, checkout time is 11:00 AM the day of discharge.   Patients discharged the day of surgery will not be allowed to drive home.  Name and phone number of your driver: spouse  Special Instructions: CHG Shower Use Special Wash: 1/2 bottle night before surgery and 1/2 bottle morning of surgery.(avoid face and genitals)   Please read over the following fact sheets that you were given: MRSA Information, Blood Transfusion fact sheet, Incentive Spirometry Instruction.

## 2012-01-23 NOTE — H&P (Signed)
Tasha Hays DOB: 09-10-1985  Chief complaint: back pain   History of Present Illness The patient is a 26 year old female who comes in today for a preoperative History and Physical. The patient is scheduled for a hemilaminectomy L5-S1 with left side microdiscectomy to be performed by Dr. Georges Lynch. Darrelyn Hillock, MD at [facility] on Thursday January 24, 2012 . Eliah presented with the chief complaint of right leg pain in March 2013. She reported that she has a history of left leg sciatic pain as well as some knee and foot problems. She stated that she has been dealing with back pain and left leg pain for several years. She reported that the right leg has been bothering her for several months but over the past couple months it has increased. She stated that she has begun to experience numbness and tingling in the right leg, typically from the knee down. 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 She was helping her grandmother but is having to lift her and help her do regular activites. She reported that this aggrevates the back and right leg. She denied any specific injury to the back or the legs. She reported that she cannot take NSAIDs as they bother her stomach. She was initially placed on a Prednisone pak which gave her minimal relief. MRI was performed which showed disc herniation at L5-S1 to the left.    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 01/15/2012 Latex   Family History Severe allergy. child Cerebrovascular Accident.  grandmother fathers side Depression. grandmother fathers side Cancer   Social History Pain Contract. no Illicit drug use. no Living situation. live with spouse Drug/Alcohol Rehab (Currently). no Current work status. unemployed Exercise. Exercises rarely; does other Drug/Alcohol Rehab (Previously). no Number of flights of stairs before winded. greater than 5 Tobacco use. current every day smoker; smoke(d) less than 1/2 pack(s) per day Tobacco / smoke exposure. yes outdoors only Marital status. married Children. 2 Alcohol use. current drinker; drinks wine and hard liquor   Medication History Robaxin (500MG  Tablet, 1 (one) Oral three times daily as needed for spasm,  Active. Cetirizine HCl (10MG  Tablet Chewable, Oral) Active. Phentermine HCl (37.5MG  Capsule, Oral) Active. NexIUM I.V. (40MG  For Solution, Intravenous) Active. Triamterene-HCTZ (37.5-25MG  Tablet, Oral) Active. INDOCIN (25MG  Tablet, Oral) Active. Dicyclomine HCl (10MG  Capsule, Oral) Active. Clorazepate Dipotassium (3.75MG  Tablet, Oral) Active. Xanax (0.25MG  Tablet, Oral as needed) Active. Flexeril (5MG  Tablet, Oral) Active. Meloxicam (15MG  Tablet, Oral) Active. NexIUM (20MG  Capsule DR, Oral) Active. Levothyroxine Sodium ( Tablet, Oral) Active. Triamterene/HCTZ (37.5-25MG  Capsule, Oral) Active. Fioricet (50-325-40MG  Tablet, Oral) Active. Uloric (40MG  Tablet, Oral) Active. Potassium Gluconate ( Tablet, Oral) Active. Lo Loestrin Fe (1 MG-10 MCG /10 MCG Tablet, Oral) Active. Dicyclomine HCl (20MG  Tablet, Oral) Active. Bactrim DS (800-160MG  Tablet, Oral two times daily) Active. Hydrochlorothiazide (25MG  Tablet, Oral 1 qd) Active.   Pregnancy / Birth History Pregnant. no   Past Surgical History Cesarean Delivery. 1 time 2007    Review of Systems General:Present- Chills. Not Present- Fever, Night Sweats, Fatigue, Weight Gain, Weight Loss and Memory Loss. Skin:Not Present-  Hives, Itching, Rash, Eczema and Lesions. HEENT:Present- Headache. Not Present- Tinnitus,  Double Vision, Visual Loss, Hearing Loss and Dentures. Respiratory:Not Present- Shortness of breath with exertion, Shortness of breath at rest, Allergies, Coughing up blood and Chronic Cough. Cardiovascular:Not Present- Chest Pain, Racing/skipping heartbeats, Difficulty Breathing Lying Down, Murmur, Swelling and Palpitations. Gastrointestinal:Present- Abdominal Pain, Vomiting, Nausea and Diarrhea. Not Present- Bloody Stool, Heartburn, Constipation, Difficulty Swallowing, Jaundice and Loss of appetitie. Female Genitourinary:Not Present- Blood in Urine, Urinary frequency, Weak urinary stream, Discharge, Flank Pain, Incontinence, Painful Urination, Urgency, Urinary Retention and Urinating at Night. Musculoskeletal:Present- Muscle Weakness, Muscle Pain, Joint Pain, Back Pain, Morning Stiffness and Spasms. Not Present- Joint Swelling. Neurological:Not Present- Tremor, Dizziness, Blackout spells, Paralysis, Difficulty with balance and Weakness. Psychiatric:Not Present- Insomnia.   Vitals Weight: 280 lb Height: 66 in Body Surface Area: 2.43 m Body Mass Index: 45.19 kg/m Pulse: 72 (Regular) Resp.: 16 (Unlabored) BP: 184/82 (Sitting, Left Arm, Standard)    Physical Exam General Mental Status - Alert, cooperative and good historian. General Appearance- pleasant. Not in acute distress. Orientation- Oriented X3. Build & Nutrition- Obese and Well developed. Head and Neck Head- normocephalic, atraumatic . Neck Global Assessment- supple. no bruit auscultated on the right and no bruit auscultated on the left. Eye Pupil- Bilateral- Regular and Round. Motion- Bilateral- EOMI. Chest and Lung Exam Auscultation: Breath sounds:- clear at anterior chest wall and - clear at posterior chest wall. Adventitious sounds:- No Adventitious  sounds. Cardiovascular Auscultation:Rhythm- Regular rate and rhythm. Heart Sounds- S1 WNL and S2 WNL. Murmurs & Other Heart Sounds:Auscultation of the heart reveals - No Murmurs. Abdomen Inspection:Contour- Obese. Palpation/Percussion:Tenderness- Abdomen is non-tender to palpation. Rigidity (guarding)- Abdomen is soft. Auscultation:Auscultation of the abdomen reveals - Bowel sounds normal. Female Genitourinary Not done, not pertinent to present illness Peripheral Vascular Upper Extremity: Palpation:- Pulses bilaterally normal. Lower Extremity: Palpation:- Pulses bilaterally normal. Neurologic Examination of related systems reveals - normal muscle strength and tone in all extremities. Neurologic evaluation reveals - normal sensation and upper and lower extremity deep tendon reflexes intact bilaterally . Musculoskeletal She has left leg pain only and a positive SLR on the left. Neurologically, there is questionable weakness of her dorsiflexors and her plantar flexors. Not sure if that's due to pain or if she has true weakness. The hips and knees are negative. Her back is painful. Pain with lumbar flexion. No masses or tumors. No CVA tenderness.  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

## 2012-01-24 ENCOUNTER — Encounter (HOSPITAL_COMMUNITY)
Admission: RE | Disposition: A | Discharge status: Home / Self Care | Payer: Self-pay | Source: Ambulatory Visit | Attending: Orthopedic Surgery

## 2012-01-24 ENCOUNTER — Ambulatory Visit (HOSPITAL_COMMUNITY)
Admission: RE | Admit: 2012-01-24 | Discharge: 2012-01-25 | Disposition: A | Discharge status: Home / Self Care | Payer: Medicaid Other | Source: Ambulatory Visit | Attending: Orthopedic Surgery | Admitting: Orthopedic Surgery

## 2012-01-24 ENCOUNTER — Ambulatory Visit (HOSPITAL_COMMUNITY): Payer: Medicaid Other

## 2012-01-24 ENCOUNTER — Encounter (HOSPITAL_COMMUNITY): Payer: Self-pay | Admitting: Anesthesiology

## 2012-01-24 ENCOUNTER — Ambulatory Visit (HOSPITAL_COMMUNITY): Payer: Medicaid Other | Admitting: Anesthesiology

## 2012-01-24 ENCOUNTER — Encounter (HOSPITAL_COMMUNITY): Payer: Self-pay | Admitting: Orthopedic Surgery

## 2012-01-24 ENCOUNTER — Encounter (HOSPITAL_COMMUNITY): Payer: Self-pay | Admitting: *Deleted

## 2012-01-24 DIAGNOSIS — Z01812 Encounter for preprocedural laboratory examination: Secondary | ICD-10-CM | POA: Insufficient documentation

## 2012-01-24 DIAGNOSIS — K219 Gastro-esophageal reflux disease without esophagitis: Secondary | ICD-10-CM | POA: Insufficient documentation

## 2012-01-24 DIAGNOSIS — Z79899 Other long term (current) drug therapy: Secondary | ICD-10-CM | POA: Insufficient documentation

## 2012-01-24 DIAGNOSIS — E039 Hypothyroidism, unspecified: Secondary | ICD-10-CM | POA: Insufficient documentation

## 2012-01-24 DIAGNOSIS — M5126 Other intervertebral disc displacement, lumbar region: Secondary | ICD-10-CM

## 2012-01-24 DIAGNOSIS — M48061 Spinal stenosis, lumbar region without neurogenic claudication: Secondary | ICD-10-CM | POA: Insufficient documentation

## 2012-01-24 DIAGNOSIS — I10 Essential (primary) hypertension: Secondary | ICD-10-CM | POA: Insufficient documentation

## 2012-01-24 HISTORY — DX: Other specified postprocedural states: Z98.890

## 2012-01-24 HISTORY — DX: Nausea with vomiting, unspecified: R11.2

## 2012-01-24 LAB — TYPE AND SCREEN
ABO/RH(D): B POS
Antibody Screen: NEGATIVE

## 2012-01-24 LAB — ABO/RH: ABO/RH(D): B POS

## 2012-01-24 SURGERY — HEMI-MICRODISCECTOMY LUMBAR LAMINECTOMY LEVEL 1
Anesthesia: General | Site: Back | Laterality: Left | Wound class: Clean

## 2012-01-24 MED ORDER — METHOCARBAMOL 500 MG PO TABS
500.0000 mg | ORAL_TABLET | Freq: Four times a day (QID) | ORAL | Status: DC | PRN
  Administered 2012-01-25: 500 mg via ORAL
  Filled 2012-01-24: qty 1

## 2012-01-24 MED ORDER — GLYCOPYRROLATE 0.2 MG/ML IJ SOLN
INTRAMUSCULAR | Status: DC | PRN
  Administered 2012-01-24: .6 mg via INTRAVENOUS

## 2012-01-24 MED ORDER — LIDOCAINE HCL (CARDIAC) 20 MG/ML IV SOLN
INTRAVENOUS | Status: DC | PRN
  Administered 2012-01-24: 100 mg via INTRAVENOUS

## 2012-01-24 MED ORDER — OXYCODONE-ACETAMINOPHEN 5-325 MG PO TABS
1.0000 | ORAL_TABLET | ORAL | Status: DC | PRN
  Administered 2012-01-25 (×2): 2 via ORAL
  Filled 2012-01-24 (×2): qty 2

## 2012-01-24 MED ORDER — SUFENTANIL CITRATE 50 MCG/ML IV SOLN
INTRAVENOUS | Status: DC | PRN
  Administered 2012-01-24 (×3): 10 ug via INTRAVENOUS

## 2012-01-24 MED ORDER — ROCURONIUM BROMIDE 100 MG/10ML IV SOLN
INTRAVENOUS | Status: DC | PRN
  Administered 2012-01-24: 50 mg via INTRAVENOUS

## 2012-01-24 MED ORDER — BISACODYL 10 MG RE SUPP: 10.0000 mg | Freq: Every day | RECTAL | Status: DC | PRN

## 2012-01-24 MED ORDER — HYDROMORPHONE HCL PF 1 MG/ML IJ SOLN
INTRAMUSCULAR | Status: AC
  Administered 2012-01-24: 1 mg via INTRAVENOUS
  Filled 2012-01-24: qty 1

## 2012-01-24 MED ORDER — PANTOPRAZOLE SODIUM 40 MG PO TBEC
40.0000 mg | DELAYED_RELEASE_TABLET | Freq: Every day | ORAL | Status: DC
  Administered 2012-01-25: 40 mg via ORAL
  Filled 2012-01-24: qty 1

## 2012-01-24 MED ORDER — PROPOFOL 10 MG/ML IV EMUL
INTRAVENOUS | Status: DC | PRN
  Administered 2012-01-24: 150 mg via INTRAVENOUS
  Administered 2012-01-24: 50 mg via INTRAVENOUS

## 2012-01-24 MED ORDER — LACTATED RINGERS IV SOLN
INTRAVENOUS | Status: DC
  Administered 2012-01-24 (×2): via INTRAVENOUS

## 2012-01-24 MED ORDER — ONDANSETRON HCL 4 MG/2ML IJ SOLN
4.0000 mg | INTRAMUSCULAR | Status: DC | PRN
  Administered 2012-01-24 – 2012-01-25 (×2): 4 mg via INTRAVENOUS
  Filled 2012-01-24 (×2): qty 2

## 2012-01-24 MED ORDER — BACITRACIN-NEOMYCIN-POLYMYXIN 400-5-5000 EX OINT
TOPICAL_OINTMENT | CUTANEOUS | Status: DC | PRN
  Administered 2012-01-24: 1 via TOPICAL

## 2012-01-24 MED ORDER — PROMETHAZINE HCL 25 MG/ML IJ SOLN
6.2500 mg | INTRAMUSCULAR | Status: DC | PRN
  Administered 2012-01-24: 6.25 mg via INTRAVENOUS

## 2012-01-24 MED ORDER — HYDROMORPHONE HCL PF 1 MG/ML IJ SOLN
INTRAMUSCULAR | Status: AC
  Filled 2012-01-24: qty 1

## 2012-01-24 MED ORDER — FLEET ENEMA 7-19 GM/118ML RE ENEM: 1.0000 | ENEMA | Freq: Once | RECTAL | Status: AC | PRN

## 2012-01-24 MED ORDER — BUPIVACAINE LIPOSOME 1.3 % IJ SUSP
20.0000 mL | Freq: Once | INTRAMUSCULAR | Status: AC
  Administered 2012-01-24: 20 mL
  Filled 2012-01-24: qty 20

## 2012-01-24 MED ORDER — CEFAZOLIN SODIUM-DEXTROSE 2-3 GM-% IV SOLR
2.0000 g | INTRAVENOUS | Status: AC
  Administered 2012-01-24: 2 g via INTRAVENOUS

## 2012-01-24 MED ORDER — PHENOL 1.4 % MT LIQD
1.0000 | OROMUCOSAL | Status: DC | PRN
  Filled 2012-01-24: qty 177

## 2012-01-24 MED ORDER — LEVOTHYROXINE SODIUM 100 MCG PO TABS
100.0000 ug | ORAL_TABLET | Freq: Every day | ORAL | Status: DC
  Administered 2012-01-25: 100 ug via ORAL
  Filled 2012-01-24 (×2): qty 1

## 2012-01-24 MED ORDER — HYDROMORPHONE HCL PF 1 MG/ML IJ SOLN
0.2500 mg | INTRAMUSCULAR | Status: DC | PRN
  Administered 2012-01-24: 0.25 mg via INTRAVENOUS

## 2012-01-24 MED ORDER — NEOSTIGMINE METHYLSULFATE 1 MG/ML IJ SOLN
INTRAMUSCULAR | Status: DC | PRN
  Administered 2012-01-24: 4 mg via INTRAVENOUS

## 2012-01-24 MED ORDER — HYDROCHLOROTHIAZIDE 25 MG PO TABS
25.0000 mg | ORAL_TABLET | Freq: Every day | ORAL | Status: DC
  Administered 2012-01-25: 25 mg via ORAL
  Filled 2012-01-24 (×2): qty 1

## 2012-01-24 MED ORDER — METHOCARBAMOL 100 MG/ML IJ SOLN
500.0000 mg | Freq: Four times a day (QID) | INTRAVENOUS | Status: DC | PRN
  Administered 2012-01-24 – 2012-01-25 (×2): 500 mg via INTRAVENOUS
  Filled 2012-01-24 (×2): qty 5

## 2012-01-24 MED ORDER — HEMOSTATIC AGENTS (NO CHARGE) OPTIME
TOPICAL | Status: DC | PRN
  Administered 2012-01-24: 1 via TOPICAL

## 2012-01-24 MED ORDER — DICYCLOMINE HCL 20 MG PO TABS
20.0000 mg | ORAL_TABLET | Freq: Four times a day (QID) | ORAL | Status: DC
  Administered 2012-01-25: 20 mg via ORAL
  Filled 2012-01-24 (×7): qty 1

## 2012-01-24 MED ORDER — ACETAMINOPHEN 325 MG PO TABS: 650.0000 mg | ORAL_TABLET | ORAL | Status: DC | PRN

## 2012-01-24 MED ORDER — PROMETHAZINE HCL 25 MG/ML IJ SOLN
INTRAMUSCULAR | Status: AC
  Filled 2012-01-24: qty 1

## 2012-01-24 MED ORDER — POLYETHYLENE GLYCOL 3350 17 G PO PACK: 17.0000 g | PACK | Freq: Every day | ORAL | Status: DC | PRN

## 2012-01-24 MED ORDER — NORETHIN-ETH ESTRAD-FE BIPHAS 1 MG-10 MCG / 10 MCG PO TABS
1.0000 | ORAL_TABLET | Freq: Every day | ORAL | Status: DC
  Administered 2012-01-25: 1 via ORAL

## 2012-01-24 MED ORDER — HYDROMORPHONE HCL PF 1 MG/ML IJ SOLN
0.5000 mg | INTRAMUSCULAR | Status: DC | PRN
  Administered 2012-01-24: 0.5 mg via INTRAVENOUS
  Administered 2012-01-24 – 2012-01-25 (×3): 1 mg via INTRAVENOUS
  Filled 2012-01-24 (×4): qty 1

## 2012-01-24 MED ORDER — ONDANSETRON HCL 4 MG/2ML IJ SOLN
INTRAMUSCULAR | Status: DC | PRN
  Administered 2012-01-24: 4 mg via INTRAVENOUS

## 2012-01-24 MED ORDER — ACETAMINOPHEN 650 MG RE SUPP: 650.0000 mg | RECTAL | Status: DC | PRN

## 2012-01-24 MED ORDER — CLORAZEPATE DIPOTASSIUM 3.75 MG PO TABS
3.7500 mg | ORAL_TABLET | Freq: Two times a day (BID) | ORAL | Status: DC
  Administered 2012-01-25: 3.75 mg via ORAL
  Filled 2012-01-24: qty 1

## 2012-01-24 MED ORDER — ACETAMINOPHEN 10 MG/ML IV SOLN
INTRAVENOUS | Status: AC
  Filled 2012-01-24: qty 100

## 2012-01-24 MED ORDER — CEFAZOLIN SODIUM 1-5 GM-% IV SOLN
1.0000 g | Freq: Three times a day (TID) | INTRAVENOUS | Status: AC
  Administered 2012-01-24 – 2012-01-25 (×3): 1 g via INTRAVENOUS
  Filled 2012-01-24 (×3): qty 50

## 2012-01-24 MED ORDER — SODIUM CHLORIDE 0.9 % IJ SOLN
INTRAMUSCULAR | Status: DC | PRN
  Administered 2012-01-24: 10 mL

## 2012-01-24 MED ORDER — ACETAMINOPHEN 10 MG/ML IV SOLN
INTRAVENOUS | Status: DC | PRN
  Administered 2012-01-24: 1000 mg via INTRAVENOUS

## 2012-01-24 MED ORDER — BACITRACIN-NEOMYCIN-POLYMYXIN 400-5-5000 EX OINT
TOPICAL_OINTMENT | CUTANEOUS | Status: AC
  Filled 2012-01-24: qty 1

## 2012-01-24 MED ORDER — SODIUM CHLORIDE 0.9 % IR SOLN
Status: DC | PRN
  Administered 2012-01-24: 13:00:00

## 2012-01-24 MED ORDER — LACTATED RINGERS IV SOLN
INTRAVENOUS | Status: DC
  Administered 2012-01-24: 14:00:00 via INTRAVENOUS
  Administered 2012-01-24: 1000 mL via INTRAVENOUS

## 2012-01-24 MED ORDER — THROMBIN 5000 UNITS EX SOLR
CUTANEOUS | Status: AC
  Filled 2012-01-24: qty 10000

## 2012-01-24 MED ORDER — THROMBIN 5000 UNITS EX SOLR
CUTANEOUS | Status: DC | PRN
  Administered 2012-01-24: 10000 [IU] via TOPICAL

## 2012-01-24 MED ORDER — PROMETHAZINE HCL 25 MG/ML IJ SOLN
6.2500 mg | INTRAMUSCULAR | Status: AC | PRN
  Administered 2012-01-24 (×2): 6.25 mg via INTRAVENOUS

## 2012-01-24 MED ORDER — FEBUXOSTAT 40 MG PO TABS
40.0000 mg | ORAL_TABLET | Freq: Every day | ORAL | Status: DC
  Administered 2012-01-25: 40 mg via ORAL
  Filled 2012-01-24 (×2): qty 1

## 2012-01-24 MED ORDER — CEFAZOLIN SODIUM-DEXTROSE 2-3 GM-% IV SOLR
INTRAVENOUS | Status: AC
Start: 2012-01-24 — End: 2012-01-24
  Filled 2012-01-24: qty 50

## 2012-01-24 MED ORDER — MENTHOL 3 MG MT LOZG
1.0000 | LOZENGE | OROMUCOSAL | Status: DC | PRN
  Filled 2012-01-24: qty 9

## 2012-01-24 MED ORDER — SCOPOLAMINE 1 MG/3DAYS TD PT72
1.0000 | MEDICATED_PATCH | Freq: Once | TRANSDERMAL | Status: DC
  Administered 2012-01-24: 1.5 mg via TRANSDERMAL
  Filled 2012-01-24: qty 1

## 2012-01-24 SURGICAL SUPPLY — 46 items
BAG ZIPLOCK 12X15 (MISCELLANEOUS) ×2 IMPLANT
BENZOIN TINCTURE PRP APPL 2/3 (GAUZE/BANDAGES/DRESSINGS) ×2 IMPLANT
CLEANER TIP ELECTROSURG 2X2 (MISCELLANEOUS) ×2 IMPLANT
CLOTH BEACON ORANGE TIMEOUT ST (SAFETY) ×2 IMPLANT
CONT SPECI 4OZ STER CLIK (MISCELLANEOUS) ×2 IMPLANT
DECANTER SPIKE VIAL GLASS SM (MISCELLANEOUS) ×2 IMPLANT
DRAIN PENROSE 18X1/4 LTX STRL (WOUND CARE) IMPLANT
DRAPE MICROSCOPE LEICA (MISCELLANEOUS) ×2 IMPLANT
DRAPE POUCH INSTRU U-SHP 10X18 (DRAPES) ×2 IMPLANT
DRAPE SURG 17X11 SM STRL (DRAPES) ×2 IMPLANT
DRSG ADAPTIC 3X8 NADH LF (GAUZE/BANDAGES/DRESSINGS) ×2 IMPLANT
DRSG PAD ABDOMINAL 8X10 ST (GAUZE/BANDAGES/DRESSINGS) ×2 IMPLANT
DURAPREP 26ML APPLICATOR (WOUND CARE) ×2 IMPLANT
ELECT REM PT RETURN 9FT ADLT (ELECTROSURGICAL) ×2
ELECTRODE REM PT RTRN 9FT ADLT (ELECTROSURGICAL) ×1 IMPLANT
GLOVE BIOGEL PI IND STRL 8 (GLOVE) ×1 IMPLANT
GLOVE BIOGEL PI IND STRL 8.5 (GLOVE) ×1 IMPLANT
GLOVE BIOGEL PI INDICATOR 8 (GLOVE) ×1
GLOVE BIOGEL PI INDICATOR 8.5 (GLOVE) ×1
GLOVE ECLIPSE 8.0 STRL XLNG CF (GLOVE) ×4 IMPLANT
GOWN PREVENTION PLUS LG XLONG (DISPOSABLE) ×4 IMPLANT
GOWN STRL REIN XL XLG (GOWN DISPOSABLE) ×4 IMPLANT
KIT BASIN OR (CUSTOM PROCEDURE TRAY) ×2 IMPLANT
KIT POSITIONING SURG ANDREWS (MISCELLANEOUS) ×2 IMPLANT
MANIFOLD NEPTUNE II (INSTRUMENTS) ×2 IMPLANT
NEEDLE SPNL 18GX3.5 QUINCKE PK (NEEDLE) ×6 IMPLANT
NS IRRIG 1000ML POUR BTL (IV SOLUTION) ×2 IMPLANT
PATTIES SURGICAL .5 X.5 (GAUZE/BANDAGES/DRESSINGS) ×2 IMPLANT
PATTIES SURGICAL .75X.75 (GAUZE/BANDAGES/DRESSINGS) IMPLANT
PATTIES SURGICAL 1X1 (DISPOSABLE) IMPLANT
PIN SAFETY NICK PLATE  2 MED (MISCELLANEOUS)
PIN SAFETY NICK PLATE 2 MED (MISCELLANEOUS) IMPLANT
POSITIONER SURGICAL ARM (MISCELLANEOUS) ×2 IMPLANT
SPONGE GAUZE 4X4 12PLY (GAUZE/BANDAGES/DRESSINGS) ×2 IMPLANT
SPONGE LAP 4X18 X RAY DECT (DISPOSABLE) IMPLANT
SPONGE SURGIFOAM ABS GEL 100 (HEMOSTASIS) ×2 IMPLANT
STAPLER VISISTAT 35W (STAPLE) IMPLANT
SUT VIC AB 0 CT1 27 (SUTURE) ×1
SUT VIC AB 0 CT1 27XBRD ANTBC (SUTURE) ×1 IMPLANT
SUT VIC AB 1 CT1 27 (SUTURE) ×4
SUT VIC AB 1 CT1 27XBRD ANTBC (SUTURE) ×4 IMPLANT
SYR 30ML LL (SYRINGE) ×2 IMPLANT
TAPE CLOTH SURG 4X10 WHT LF (GAUZE/BANDAGES/DRESSINGS) ×2 IMPLANT
TOWEL OR 17X26 10 PK STRL BLUE (TOWEL DISPOSABLE) ×4 IMPLANT
TRAY LAMINECTOMY (CUSTOM PROCEDURE TRAY) ×2 IMPLANT
WATER STERILE IRR 1500ML POUR (IV SOLUTION) ×2 IMPLANT

## 2012-01-24 NOTE — Anesthesia Postprocedure Evaluation (Signed)
  Anesthesia Post-op Note  Patient: Tasha Hays  Procedure(s) Performed: Procedure(s) (LRB): HEMI-MICRODISCECTOMY LUMBAR LAMINECTOMY LEVEL 1 (Left)  Patient Location: PACU  Anesthesia Type: General  Level of Consciousness: oriented and sedated  Airway and Oxygen Therapy: Patient Spontanous Breathing and Patient connected to nasal cannula oxygen  Post-op Pain: mild  Post-op Assessment: Post-op Vital signs reviewed, Patient's Cardiovascular Status Stable, Respiratory Function Stable and Patent Airway  Post-op Vital Signs: stable  Complications: No apparent anesthesia complications

## 2012-01-24 NOTE — Transfer of Care (Signed)
Immediate Anesthesia Transfer of Care Note  Patient: Tasha Hays  Procedure(s) Performed: Procedure(s) (LRB): HEMI-MICRODISCECTOMY LUMBAR LAMINECTOMY LEVEL 1 (Left)  Patient Location: PACU  Anesthesia Type: General  Level of Consciousness: awake, oriented and patient cooperative  Airway & Oxygen Therapy: Patient Spontanous Breathing and Patient connected to face mask oxygen  Post-op Assessment: Report given to PACU RN, Post -op Vital signs reviewed and stable and Patient moving all extremities  Post vital signs: Reviewed and stable  Complications: No apparent anesthesia complications

## 2012-01-24 NOTE — Anesthesia Preprocedure Evaluation (Signed)
Anesthesia Evaluation  Patient identified by MRN, date of birth, ID band Patient awake    Reviewed: Allergy & Precautions, H&P , NPO status , Patient's Chart, lab work & pertinent test results, reviewed documented beta blocker date and time   Airway Mallampati: II TM Distance: >3 FB Neck ROM: Full    Dental  (+) Teeth Intact and Dental Advisory Given   Pulmonary neg pulmonary ROS,  breath sounds clear to auscultation        Cardiovascular hypertension, Pt. on medications Rhythm:Regular Rate:Normal     Neuro/Psych negative neurological ROS  negative psych ROS   GI/Hepatic negative GI ROS, Neg liver ROS,   Endo/Other  Hypothyroidism Morbid obesityThyroid replacement  Renal/GU negative Renal ROS  negative genitourinary   Musculoskeletal   Abdominal   Peds negative pediatric ROS (+)  Hematology negative hematology ROS (+)   Anesthesia Other Findings   Reproductive/Obstetrics negative OB ROS                           Anesthesia Physical Anesthesia Plan  ASA: II  Anesthesia Plan: General   Post-op Pain Management:    Induction: Intravenous  Airway Management Planned: Oral ETT  Additional Equipment:   Intra-op Plan:   Post-operative Plan: Extubation in OR  Informed Consent: I have reviewed the patients History and Physical, chart, labs and discussed the procedure including the risks, benefits and alternatives for the proposed anesthesia with the patient or authorized representative who has indicated his/her understanding and acceptance.   Dental advisory given  Plan Discussed with: CRNA and Surgeon  Anesthesia Plan Comments:         Anesthesia Quick Evaluation

## 2012-01-24 NOTE — Progress Notes (Signed)
1107 Feeling better color normal not as sweaty and no c/o nausea stated she had a headache light turned off

## 2012-01-24 NOTE — Interval H&P Note (Signed)
History and Physical Interval Note:  01/24/2012 12:39 PM  Tasha Hays  has presented today for surgery, with the diagnosis of herniated disc  The various methods of treatment have been discussed with the patient and family. After consideration of risks, benefits and other options for treatment, the patient has consented to  Procedure(s) (LRB): HEMI-MICRODISCECTOMY LUMBAR LAMINECTOMY LEVEL 1 (Left) as a surgical intervention .  The patient's history has been reviewed, patient examined, no change in status, stable for surgery.  I have reviewed the patients' chart and labs.  Questions were answered to the patient's satisfaction.     Shiniqua Groseclose A

## 2012-01-24 NOTE — Progress Notes (Signed)

## 2012-01-24 NOTE — Progress Notes (Signed)
1048 Attempted to draw blood fainted became had nausea no vomiting sweating

## 2012-01-24 NOTE — H&P (View-Only) (Signed)
1107 Feeling better color normal not as sweaty and no c/o nausea stated she had a headache light turned off         

## 2012-01-24 NOTE — Brief Op Note (Signed)
01/24/2012  2:42 PM  PATIENT:  Tasha Hays  26 y.o. female  PRE-OPERATIVE DIAGNOSIS:  herniated disc and stenosis L-5--S-1 on left  POST-OPERATIVE DIAGNOSIS:  herniated disc and Stenosis at L-,--S-1 on left  PROCEDURE:  Procedure(s) (LRB): HEMI-MICRODISCECTOMY LUMBAR LAMINECTOMY LEVEL 1 (Left),L-5--S-1 and decompression for stenosis.  SURGEON:  Surgeon(s) and Role:    * Jacki Cones, MD - Primary    * Drucilla Schmidt, MD     ASSISTANTS:James Aplington MD  ANESTHESIA:   general  EBL:  Total I/O In: 1500 [I.V.:1500] Out: -   BLOOD ADMINISTERED:none  DRAINS: none   LOCAL MEDICATIONS USED:  BUPIVICAINE 20cc mixed with 10cc of Normal Saline.  SPECIMEN:  Source of Specimen:  L-5--S-1 on left.  DISPOSITION OF SPECIMEN:  PATHOLOGY  COUNTS:  YES  TOURNIQUET:  * No tourniquets in log *  DICTATION: .Other Dictation: Dictation Number [id number]  PLAN OF CARE: Admit for overnight observation  PATIENT DISPOSITION:  PACU - hemodynamically stable.   Delay start of Pharmacological VTE agent (>24hrs) due to surgical blood loss or risk of bleeding: yes

## 2012-01-24 NOTE — Progress Notes (Signed)
1055 Spoke with Tasha Hays in holding will collect T & S after starting her IV, said she will faint with needle but that  had not happened in a long time.

## 2012-01-24 NOTE — Progress Notes (Signed)
1054 Assessment done vital signs stable 97.1 89-18 133/89 99 % close to what they were on admission,sweaty with nausea, cold cloth appllied to face and back of neck head of bed elevated. Husband at the bedside

## 2012-01-25 MED ORDER — OXYCODONE-ACETAMINOPHEN 5-325 MG PO TABS: 1.0000 | ORAL_TABLET | ORAL | Status: AC | PRN

## 2012-01-25 MED ORDER — METHOCARBAMOL 500 MG PO TABS: 500.0000 mg | ORAL_TABLET | Freq: Three times a day (TID) | ORAL | Status: DC | PRN

## 2012-01-25 NOTE — Evaluation (Signed)
Occupational Therapy Evaluation Patient Details Name: Tasha Hays MRN: [id number] DOB: 1985/07/23 Today's Date: 01/25/2012 Time: 0940-1010 OT Time Calculation (min): 30 min  OT Assessment / Plan / Recommendation Clinical Impression  This 26 year old female was admitted for L5-S1 microdisc.  She presents with back precautions and has sciatica nerve pain as well as muscle spasms.  will plan to see her one more time in acute to reinforce education for precautions and provide AE.      OT Assessment       Follow Up Recommendations  No OT follow up    Barriers to Discharge      Equipment Recommendations       Recommendations for Other Services    Frequency       Precautions / Restrictions Precautions Precautions: Back Precaution Booklet Issued:  (handout) Restrictions Weight Bearing Restrictions: No   Pertinent Vitals/Pain BACK PAIN AND SPASMS--MODERATE TO SEVERE.  Spasms subsided during tx.  Pt was premedicated    ADL  Eating/Feeding: Simulated;Independent Where Assessed - Eating/Feeding: Chair Grooming: Simulated;Supervision/safety Where Assessed - Grooming: Supported standing Upper Body Bathing: Simulated;Set up Where Assessed - Upper Body Bathing: Supported sitting Lower Body Bathing: Simulated;Maximal assistance Where Assessed - Lower Body Bathing: Supported sit to stand Upper Body Dressing: Simulated;Minimal assistance (has difficulty internal rotation RUE) Where Assessed - Upper Body Dressing: Supported sit to stand Lower Body Dressing: Simulated;Maximal assistance Where Assessed - Lower Body Dressing: Supported sit to stand Transfers/Ambulation Related to ADLs: pt was standing with nursing when I arrived.  Assisted to sitting in chair:  min A to get hips to back of chair.  ADL Comments: Familiar with AE.  Would benefit from toilet aid and reacher:  will issue if we have them available.  Reviewed back precautions with adls.      OT Diagnosis:    OT Problem List:    OT Treatment Interventions:     OT Goals Acute Rehab OT Goals OT Goal Formulation: With patient Time For Goal Achievement: 02/01/12 Potential to Achieve Goals: Good ADL Goals Pt Will Transfer to Toilet: with supervision;Ambulation;Comfort height toilet;Grab bars ADL Goal: Toilet Transfer - Progress: Goal set today Pt Will Perform Tub/Shower Transfer: with min assist;Ambulation;Transfer tub bench;Maintaining back safety precautions ADL Goal: Tub/Shower Transfer - Progress: Goal set today Miscellaneous OT Goals Miscellaneous OT Goal #1: pt will be independent with reacher and toilet aid, following back precautions OT Goal: Miscellaneous Goal #1 - Progress: Goal set today  Visit Information  Last OT Received On: 01/25/12 Assistance Needed: +1    Subjective Data  Subjective: "I'm sorry.  I'm cramping so bad" Patient Stated Goal: Get back to work   Prior Functioning  Vision/Perception  Home Living Lives With: Spouse (64 year old and 42 year old) Available Help at Discharge: Family Bathroom Shower/Tub: Tub/shower unit Bathroom Toilet: Handicapped height Additional Comments: has grab bar by toilet.  Can borrow tub bench from grandmother Prior Function Level of Independence: Independent Communication Communication: No difficulties Dominant Hand: Right      Cognition  Overall Cognitive Status: Appears within functional limits for tasks assessed/performed Behavior During Session: [facility] for tasks performed    Extremity/Trunk Assessment Right Upper Extremity Assessment RUE ROM/Strength/Tone: Deficits RUE ROM/Strength/Tone Deficits: has problem with R shoulder:  unable to internally rotate fully.  Bil UEs limited to 90 because of back precautions Left Upper Extremity Assessment LUE ROM/Strength/Tone: Deficits   Mobility     Exercise    Balance    End of Session OT -  End of Session Activity Tolerance: Patient limited by pain Patient left: in chair;with call bell/phone within  reach  GO     [facility] 01/25/2012, 10:26 AM Marica Otter, OTR/L [phone] 01/25/2012

## 2012-01-25 NOTE — Progress Notes (Signed)
Occupational Therapy Treatment Patient Details Name: Tasha Hays MRN: [id number] DOB: [birth year] Today's Date: 01/25/2012 Time: 2956-2130 OT Time Calculation (min): 21 min  OT Assessment / Plan / Recommendation Comments on Treatment Session Pt is able to use AE with set up:  had difficulty with pants due to stretchy material    Follow Up Recommendations  No OT follow up    Barriers to Discharge       Equipment Recommendations       Recommendations for Other Services    Frequency     Plan      Precautions / Restrictions Precautions Precautions: Back Precaution Booklet Issued:  (handout) Restrictions Weight Bearing Restrictions: No   Pertinent Vitals/Pain Back pain--one spasm.  Repositioned    ADL    Transfers/Ambulation Related to ADLs: pt had just finished using bathroom; reports comfort height works well--has this at home with grab bar ADL Comments: educated on AE.  Provided reacher, long sponge and tong toilet aid.  Pt performed pants with min A.  Simulated LB bathing with sponge, and also simulated toilet hygiene   OT Diagnosis:    OT Problem List:   OT Treatment Interventions:     OT Goals Acute Rehab OT Goals OT Goal Formulation: With patient Time For Goal Achievement: 02/01/12 Potential to Achieve Goals: Good ADL Goals Pt Will Transfer to Toilet: with supervision;Ambulation;Comfort height toilet;Grab bars ADL Goal: Toilet Transfer - Progress: Other (comment) (verbalizes) Pt Will Perform Tub/Shower Transfer: with min assist;Ambulation;Transfer tub bench;Maintaining back safety precautions ADL Goal: Tub/Shower Transfer - Progress: Other (comment) (verbalizes:  did not feel she needed to practice) Miscellaneous OT Goals Miscellaneous OT Goal #1: pt will be independent with reacher and toilet aid, following back precautions OT Goal: Miscellaneous Goal #1 - Progress: Met (pt independent with AE, but not adls)  Visit Information  Last OT Received On:  01/25/12 Assistance Needed: +1    Subjective Data  Subjective: "I'm sorry.  I'm cramping so bad" Patient Stated Goal: Get back to work   Prior Functioning  Home Living Lives With: Spouse (47 year old and 71 year old) Available Help at Discharge: Family Bathroom Shower/Tub: Tub/shower unit Bathroom Toilet: Handicapped height Additional Comments: has grab bar by toilet.  Can borrow tub bench from grandmother Prior Function Level of Independence: Independent Communication Communication: No difficulties Dominant Hand: Right    Cognition  Overall Cognitive Status: Appears within functional limits for tasks assessed/performed Behavior During Session: [facility] for tasks performed    Mobility     Exercises    Balance    End of Session OT - End of Session Activity Tolerance: Patient tolerated treatment well Patient left: in chair;with call bell/phone within reach  GO     [facility] 01/25/2012, 11:56 AM Marica Otter, OTR/L [phone] 01/25/2012

## 2012-01-25 NOTE — Evaluation (Signed)
Physical Therapy Evaluation Patient Details Name: Tasha Hays MRN: [id number] DOB: [birth year] Today's Date: 01/25/2012 Time: 6578-4696 PT Time Calculation (min): 48 min  PT Assessment / Plan / Recommendation Clinical Impression  Pt s/p lumbar surgery presents with pain limiting functional mobility    PT Assessment  Patient needs continued PT services    Follow Up Recommendations  No PT follow up    Barriers to Discharge        Equipment Recommendations  Rolling walker with 5" wheels    Recommendations for Other Services OT consult   Frequency 7X/week    Precautions / Restrictions Precautions Precautions: Back Restrictions Weight Bearing Restrictions: No   Pertinent Vitals/Pain 7/10      Mobility  Bed Mobility Bed Mobility: Rolling Right;Right Sidelying to Sit Rolling Right: 3: Mod assist Right Sidelying to Sit: 3: Mod assist Details for Bed Mobility Assistance: multimodal cues and mod assist for correct Log Roll and move to sitting Transfers Transfers: Sit to Stand;Stand to Sit Sit to Stand: 3: Mod assist Stand to Sit: 3: Mod assist;4: Min assist Details for Transfer Assistance: cues for back precautions, use of UEs to self assist Ambulation/Gait Ambulation/Gait Assistance: 4: Min assist Ambulation Distance (Feet): 300 Feet Assistive device: Rolling walker Ambulation/Gait Assistance Details: cues for posture and position from RW Gait Pattern: Step-to pattern;Step-through pattern    Exercises     PT Diagnosis: Difficulty walking  PT Problem List: Decreased activity tolerance;Decreased mobility;Decreased knowledge of use of DME;Pain;Obesity PT Treatment Interventions: DME instruction;Gait training;Stair training;Functional mobility training;Therapeutic exercise;Therapeutic activities;Patient/family education   PT Goals Acute Rehab PT Goals PT Goal Formulation: With patient Time For Goal Achievement: 01/28/12 Potential to Achieve Goals: Good Pt will go  Supine/Side to Sit: with supervision PT Goal: Supine/Side to Sit - Progress: Goal set today Pt will go Sit to Supine/Side: with supervision PT Goal: Sit to Supine/Side - Progress: Goal set today Pt will go Sit to Stand: with supervision PT Goal: Sit to Stand - Progress: Goal set today Pt will go Stand to Sit: with supervision PT Goal: Stand to Sit - Progress: Goal set today Pt will Ambulate: 51 - 150 feet;with supervision;with rolling walker PT Goal: Ambulate - Progress: Goal set today Pt will Go Up / Down Stairs: 1-2 stairs;with min assist;with least restrictive assistive device PT Goal: Up/Down Stairs - Progress: Goal set today  Visit Information  Last PT Received On: 01/25/12 Assistance Needed: +1    Subjective Data  Subjective: It hurts but I need to move Patient Stated Goal: Resume previous lifestyle with decreased pain   Prior Functioning  Home Living Lives With: Spouse Available Help at Discharge: Family Type of Home: Mobile home Home Access: Stairs to enter Secretary/administrator of Steps: 1+1 Entrance Stairs-Rails: None Home Layout: One level Home Adaptive Equipment: None Prior Function Level of Independence: Independent Able to Take Stairs?: Yes Driving: Yes Communication Communication: No difficulties Dominant Hand: Right    Cognition  Overall Cognitive Status: Appears within functional limits for tasks assessed/performed Arousal/Alertness: Awake/alert Orientation Level: Appears intact for tasks assessed Behavior During Session: [facility] for tasks performed    Extremity/Trunk Assessment Right Upper Extremity Assessment RUE ROM/Strength/Tone: Deficits RUE ROM/Strength/Tone Deficits: has problem with R shoulder:  unable to internally rotate fully.  Bil UEs limited to 90 because of back precautions Left Upper Extremity Assessment LUE ROM/Strength/Tone: Deficits Right Lower Extremity Assessment RLE ROM/Strength/Tone: [facility] for tasks assessed Left Lower Extremity  Assessment LLE ROM/Strength/Tone: [facility] for tasks assessed   Balance  End of Session PT - End of Session Activity Tolerance: Patient tolerated treatment well;Patient limited by pain Patient left: in chair;with call bell/phone within reach Nurse Communication: Mobility status  GP Functional Assessment Tool Used: clinical judgement Functional Limitation: Mobility: Walking and moving around Mobility: Walking and Moving Around Current Status ([id number]): At least 20 percent but less than 40 percent impaired, limited or restricted Mobility: Walking and Moving Around Goal Status [phone]): At least 1 percent but less than 20 percent impaired, limited or restricted   [facility] 01/25/2012, 3:42 PM

## 2012-01-25 NOTE — Progress Notes (Signed)
   Subjective: 1 Day Post-Op Procedure(s) (LRB): HEMI-MICRODISCECTOMY LUMBAR LAMINECTOMY LEVEL 1 (Left) Patient reports pain as moderate.   Patient seen in rounds with Dr. Darrelyn Hillock. Patient is well, and has had no acute complaints or problems. She reports that she has pain but that it is well-controlled with pain meds and muscle relaxers together. She has had some difficult with the pain meds irritating her stomach but if she takes them slowly she does ok. No compliants of chest pain and shortness of breath. She reports that she has walked since the surgery and is voiding well on her own. She is ready to go home today.    Objective: Vital signs in last 24 hours: Temp:  [97.1 F (36.2 C)-98.9 F (37.2 C)] 97.9 F (36.6 C) (07/12 0636) Pulse Rate:  [74-112] 82  (07/12 0636) Resp:  [1-27] 14  (07/12 0636) BP: (100-167)/(63-97) 106/68 mmHg (07/12 0636) SpO2:  [95 %-100 %] 98 % (07/12 0636) Weight:  [116.121 kg (256 lb)] 116.121 kg (256 lb) (07/11 1620)  Intake/Output from previous day:  Intake/Output Summary (Last 24 hours) at 01/25/12 0731 Last data filed at 01/25/12 0600  Gross per 24 hour  Intake 3848.33 ml  Output    902 ml  Net 2946.33 ml      EXAM General - Patient is Alert and Oriented Extremity - Neurologically intact Intact pulses distally Dorsiflexion/Plantar flexion intact Dressing/Incision - dressing C/D/I Motor Function - intact, moving foot and toes well on exam.   Past Medical History  Diagnosis Date  . Anxiety   . Gout   . GERD (gastroesophageal reflux disease)   . H. pylori infection   . IBS (irritable bowel syndrome)   . Chronic headaches   . Gastric ulcer   . HTN (hypertension)   . Hypothyroidism   . Kidney stones   . Obesity   . Erosive esophagitis   . Diverticula of colon   . Arthritis 01-15-12    Rt. shoulder, left leg,/ ankle.  . Transfusion history 01-15-12    4 yrs ago with esophagitis  . Fractures 01-15-12     Past hx. -Rt. Wrist and left leg   . Piercing 01-15-12    vaginal/labia piercing-pt. made aware to remove.  . Vagal reaction 01-15-12    tends to has syncope with venipunctures  . Family history of anesthesia complication 01-15-12    had some issues with being sedated adequately  . PONV (postoperative nausea and vomiting)     Assessment/Plan: 1 Day Post-Op Procedure(s) (LRB): HEMI-MICRODISCECTOMY LUMBAR LAMINECTOMY LEVEL 1 (Left)  Advance diet Up with therapy D/C IV fluids Discharge home  Weight-Bearing as tolerated  Discharge on oxycodone and Robaxin Change dressing before discharge  Josiel Gahm LAUREN 01/25/2012, 7:31 AM

## 2012-01-25 NOTE — Progress Notes (Signed)
Physical Therapy Treatment Patient Details Name: Tasha Hays MRN: [id number] DOB: 15-Jan-1986 Today's Date: 01/25/2012 Time: 1212-1240 PT Time Calculation (min): 28 min  PT Assessment / Plan / Recommendation Comments on Treatment Session  Pt's spouse present and educated with pt on back precautions, stairs, mobility techniques,     Follow Up Recommendations  No PT follow up    Barriers to Discharge        Equipment Recommendations  Rolling walker with 5" wheels    Recommendations for Other Services OT consult  Frequency 7X/week   Plan Discharge plan remains appropriate    Precautions / Restrictions Precautions Precautions: Back Precaution Comments: reviewed precautions with pt and spouse Restrictions Weight Bearing Restrictions: No   Pertinent Vitals/Pain     Mobility  Bed Mobility Bed Mobility: Rolling Right;Right Sidelying to Sit Rolling Right: 4: Min guard Right Sidelying to Sit: 4: Min guard Sit to Supine: 4: Min guard Details for Bed Mobility Assistance: multimodal cues for correct log roll and move to/from sitting.  Multiple reps performed with pt progressing to min quard assist and cues for form/technique Transfers Transfers: Sit to Stand;Stand to Sit Sit to Stand: 4: Min guard Stand to Sit: 4: Min guard Details for Transfer Assistance: min cues for technique  Ambulation/Gait Ambulation/Gait Assistance: 4: Min guard;5: Supervision Ambulation Distance (Feet):  (ambulating in room) Assistive device: Rolling walker Ambulation/Gait Assistance Details: cues for position from RW Gait Pattern: Step-to pattern;Step-through pattern Stairs:  (pt with 1+1 stairs - reviewed verbally with pt and spouse)    Exercises     PT Diagnosis: Difficulty walking  PT Problem List: Decreased activity tolerance;Decreased mobility;Decreased knowledge of use of DME;Pain;Obesity PT Treatment Interventions: DME instruction;Gait training;Stair training;Functional mobility  training;Therapeutic exercise;Therapeutic activities;Patient/family education   PT Goals Acute Rehab PT Goals PT Goal Formulation: With patient Time For Goal Achievement: 01/28/12 Potential to Achieve Goals: Good Pt will go Supine/Side to Sit: with supervision PT Goal: Supine/Side to Sit - Progress: Progressing toward goal Pt will go Sit to Supine/Side: with supervision PT Goal: Sit to Supine/Side - Progress: Progressing toward goal Pt will go Sit to Stand: with supervision PT Goal: Sit to Stand - Progress: Progressing toward goal Pt will go Stand to Sit: with supervision PT Goal: Stand to Sit - Progress: Progressing toward goal Pt will Ambulate: 51 - 150 feet;with supervision;with rolling walker PT Goal: Ambulate - Progress: Progressing toward goal Pt will Go Up / Down Stairs: 1-2 stairs;with min assist;with least restrictive assistive device PT Goal: Up/Down Stairs - Progress: Progressing toward goal  Visit Information  Last PT Received On: 01/25/12 Assistance Needed: +1    Subjective Data  Subjective: I need to work on getting into bed Patient Stated Goal: Resume previous lifestyle with decreased pain   Cognition  Overall Cognitive Status: Appears within functional limits for tasks assessed/performed Arousal/Alertness: Awake/alert Orientation Level: Appears intact for tasks assessed Behavior During Session: [facility] for tasks performed    Balance     End of Session PT - End of Session Activity Tolerance: Patient tolerated treatment well Patient left: in chair;with call bell/phone within reach Nurse Communication: Mobility status   GP Functional Assessment Tool Used: clinical judgement Functional Limitation: Mobility: Walking and moving around Mobility: Walking and Moving Around Current Status [phone]): At least 1 percent but less than 20 percent impaired, limited or restricted Mobility: Walking and Moving Around Goal Status [phone]): At least 1 percent but less than 20  percent impaired, limited or restricted Mobility: Walking and Moving Around  Discharge Status [phone]): At least 1 percent but less than 20 percent impaired, limited or restricted   [facility] 01/25/2012, 3:52 PM

## 2012-01-25 NOTE — Op Note (Signed)
Tasha Hays, TELLERIA NO.:  [id number]  MEDICAL RECORD NO.:  [id number]  LOCATION:  1615                         FACILITY:  [facility]  PHYSICIAN:  Georges Lynch. Zymir Napoli, M.D.DATE OF BIRTH:  1986-05-19  DATE OF PROCEDURE: DATE OF DISCHARGE:                              OPERATIVE REPORT   SURGEON:  Trea Carnegie A. Darrelyn Hillock, MD  ASSISTANT:  Dr. Lavell Islam.  POSTOPERATIVE DIAGNOSES: 1. 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 2. Foraminal stenosis, L5-S1 on the left. 3. Herniated disk, L5-S1 on the left.  Note, she had some weakness of the dorsiflexors of her left foot preop. I could not determine whether that was due to pain or an actual weakness.  POSTOPERATIVE DIAGNOSIS: 1. 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 2.  Foraminal     stenosis, L5-S1 on the left. 2. Herniated disk, L5-S1 on the left.  Note, she had some weakness of the dorsiflexors of her left foot preop. I could not determine whether that was due to pain or an actual weakness.  OPERATION: 1. Decompressive lateral recess, L5-S1, on the left. 2. Microdiskectomy, L5-S1, on the left. 3. Foraminotomy for the S1 root, at L5-S1 on the left.  PROCEDURE:  Under general anesthesia, the patient was on spinal frame, routine orthopedic prep and draping the lower back was carried out.  The appropriate time-out was carried out in the operating room.  Prior to that, I marked the appropriate left side of her back in the holding area.  At this time, 2 needles were placed in the back for localization purposes.  X-ray was taken.  Incision then was made over the L5-S1 interspace, bleeders identified and cauterized.  I then carried the incision down to the lamina of L5-S1.  The microscope was brought in. Several x-rays were taken.  We took a final x-ray.  When we were at the disk space, it was labeled L5-S1 by the radiologist.  At this time, the hemilaminectomy in usual fashion we went down first distally  and decompressed the lateral recess because of the tightness of the nerve in that area.  We also went out laterally and decompressed the lateral recess.  By use of the microscope, we gently retracted the S1 root, and cauterized lateral recess veins.  I then made a cruciate incision in the posterior longitudinal ligament.  Microdiskectomy was carried out in usual fashion.  I went in with the nerve hook and the Epstein curettes of ligamentous to make sure we had the area cleared out and the nerve root totally freed.  We made several passes up in the disk space.  We thoroughly irrigated out the area and there was no other compromise in most of the areas.  The dura now was freely movable.  The S1 root was freely movable as well.  Passed a hockey-stick down the foramina, anterior and posterior to the S1 root and the root was free.  There were no fragments that migrated distally.  I thoroughly irrigated out the area, loosely applied some thrombin-soaked Gelfoam, closed the wound layers in usual fashion except I left a small deep part of the wound. The distal and proximal part of the wound  opened for drainage purposes. I then injected a mixture of 20 mL of Exparel with 10 mL of normal saline in the soft tissue, and the wound was closed in layers that I mentioned.  She had a tattoo that I carefully put back together as well as I could.  We utilized staples at this point.  Sterile Neosporin dressing was applied.  She had 2 g of IV Ancef preop.          ______________________________ Georges Lynch. Darrelyn Hillock, M.D.     RAG/MEDQ  D:  01/24/2012  T:  01/25/2012  Job:  [id number]

## 2012-04-23 ENCOUNTER — Telehealth: Payer: Self-pay | Admitting: Gastroenterology

## 2012-04-23 NOTE — Telephone Encounter (Signed)
Patient was seen by her primary care MD and they prescribed azithromycin.  She says her stomach has hurt since and wants to know what to do.  She is advised to call them back and let them know and ask for an alternative medication.

## 2012-07-15 ENCOUNTER — Other Ambulatory Visit (HOSPITAL_COMMUNITY): Payer: Self-pay | Admitting: Family Medicine

## 2012-07-15 ENCOUNTER — Ambulatory Visit (HOSPITAL_COMMUNITY)
Admission: RE | Admit: 2012-07-15 | Discharge: 2012-07-15 | Disposition: A | Discharge status: Home / Self Care | Payer: Medicaid Other | Source: Ambulatory Visit | Attending: Family Medicine | Admitting: Family Medicine

## 2012-07-15 DIAGNOSIS — M549 Dorsalgia, unspecified: Secondary | ICD-10-CM

## 2012-07-15 DIAGNOSIS — R310 Gross hematuria: Secondary | ICD-10-CM

## 2012-07-15 DIAGNOSIS — N201 Calculus of ureter: Secondary | ICD-10-CM | POA: Insufficient documentation

## 2012-08-11 ENCOUNTER — Other Ambulatory Visit: Payer: Self-pay

## 2012-08-11 DIAGNOSIS — K219 Gastro-esophageal reflux disease without esophagitis: Secondary | ICD-10-CM

## 2012-08-11 MED ORDER — ESOMEPRAZOLE MAGNESIUM 40 MG PO CPDR: 40.0000 mg | DELAYED_RELEASE_CAPSULE | Freq: Two times a day (BID) | ORAL | Status: DC

## 2012-09-10 ENCOUNTER — Other Ambulatory Visit: Payer: Self-pay | Admitting: Gastroenterology

## 2012-09-11 NOTE — Telephone Encounter (Signed)
NEEDS OFFICE VISIT FOR ANY FURTHER REFILLS! 

## 2012-09-30 ENCOUNTER — Ambulatory Visit (INDEPENDENT_AMBULATORY_CARE_PROVIDER_SITE_OTHER): Payer: Medicaid Other | Admitting: Nurse Practitioner

## 2012-09-30 ENCOUNTER — Encounter: Payer: Self-pay | Admitting: Nurse Practitioner

## 2012-09-30 VITALS — BP 167/111 | HR 89 | Temp 98.4°F | Wt 241.5 lb

## 2012-09-30 DIAGNOSIS — J069 Acute upper respiratory infection, unspecified: Secondary | ICD-10-CM

## 2012-09-30 DIAGNOSIS — H6690 Otitis media, unspecified, unspecified ear: Secondary | ICD-10-CM

## 2012-09-30 DIAGNOSIS — J020 Streptococcal pharyngitis: Secondary | ICD-10-CM

## 2012-09-30 MED ORDER — CEFDINIR 300 MG PO CAPS: 300.0000 mg | ORAL_CAPSULE | Freq: Two times a day (BID) | ORAL | Status: DC

## 2012-09-30 NOTE — Patient Instructions (Signed)
Force fluids Rest meds as rx Motrin or Tylenol OTC fever and aches

## 2012-09-30 NOTE — Progress Notes (Signed)
  Subjective:    Patient ID: Tasha Hays, female    DOB: [birth year], 27 y.o.   MRN: [id number]  HPI BP 167/111  Pulse 89  Temp(Src) 98.4 F (36.9 C) (Oral)  Wt 241 lb 8 oz (109.544 kg)  BMI 37.82 kg/m2  LMP 09/11/2012 Patient in complaining of sore throat and right ear pain . Started 2day. Has gotten worse since started. Associated symptoms include Congestion. He has tried equate cold and sinus  OTC without relief     Review of Systems     Objective:   Physical Exam  Constitutional: She is oriented to person, place, and time. She appears well-developed and well-nourished.  HENT:  Right Ear: Tympanic membrane is erythematous (effusion).  Nose: Mucosal edema, rhinorrhea and sinus tenderness (bil maxillary sinus ) present.  Mouth/Throat: Posterior oropharyngeal erythema present.  Eyes: Pupils are equal, round, and reactive to light.  Neck: Normal range of motion. Neck supple.  Cardiovascular: Normal rate.   Pulmonary/Chest: Effort normal and breath sounds normal.  Neurological: She is alert and oriented to person, place, and time.  Skin: Skin is warm and dry.  Psychiatric: She has a normal mood and affect. Her behavior is normal. Judgment and thought content normal.   Strep test neagtive        Assessment & Plan:  A: URI, pharyngitis, Rt OM  P; MEDS as RX     Motrin or tylenol OTC    Force fluids    rest

## 2012-11-03 ENCOUNTER — Other Ambulatory Visit: Payer: Self-pay | Admitting: Nurse Practitioner

## 2012-12-01 ENCOUNTER — Telehealth: Payer: Self-pay | Admitting: Nurse Practitioner

## 2012-12-01 NOTE — Telephone Encounter (Signed)
insuranc ewon't cover it and it is expensive- You can look online and see if you can print out coupon.-let me know if want

## 2012-12-02 ENCOUNTER — Telehealth: Payer: Self-pay | Admitting: *Deleted

## 2012-12-02 MED ORDER — LORCASERIN HCL 10 MG PO TABS: 1.0000 | ORAL_TABLET | Freq: Every day | ORAL | Status: DC

## 2012-12-02 NOTE — Telephone Encounter (Signed)
rx ready for pickup 

## 2012-12-02 NOTE — Telephone Encounter (Signed)
PT NOTIFIED. WILL NOT TRY MED FOR NOW.

## 2012-12-02 NOTE — Telephone Encounter (Signed)
Found coupon for free 30day trial for belviq  Can we please prescribe

## 2012-12-02 NOTE — Telephone Encounter (Signed)
PT NOTIFIED TO PICKUP RX. 

## 2012-12-03 ENCOUNTER — Other Ambulatory Visit: Payer: Self-pay | Admitting: Gastroenterology

## 2012-12-03 ENCOUNTER — Other Ambulatory Visit: Payer: Self-pay | Admitting: Nurse Practitioner

## 2012-12-03 MED ORDER — LORCASERIN HCL 10 MG PO TABS: 1.0000 | ORAL_TABLET | Freq: Two times a day (BID) | ORAL | Status: DC

## 2012-12-04 NOTE — Telephone Encounter (Signed)
DID NOT SEE MED ON MEDICATION LIST. WAS SENT OVER BY SURESCRIPT. LAST OV 3/18.

## 2012-12-22 ENCOUNTER — Ambulatory Visit (INDEPENDENT_AMBULATORY_CARE_PROVIDER_SITE_OTHER): Payer: Medicaid Other | Admitting: *Deleted

## 2012-12-22 DIAGNOSIS — Z569 Unspecified problems related to employment: Secondary | ICD-10-CM

## 2012-12-22 DIAGNOSIS — Z0000 Encounter for general adult medical examination without abnormal findings: Secondary | ICD-10-CM

## 2012-12-22 DIAGNOSIS — Z111 Encounter for screening for respiratory tuberculosis: Secondary | ICD-10-CM

## 2012-12-24 ENCOUNTER — Telehealth: Payer: Self-pay | Admitting: *Deleted

## 2012-12-24 MED ORDER — LORCASERIN HCL 10 MG PO TABS: 1.0000 | ORAL_TABLET | Freq: Two times a day (BID) | ORAL | Status: DC

## 2012-12-24 NOTE — Telephone Encounter (Signed)
Has another 15 day free on belviq please write for bid #[phone] for 15 day supply  Lost 11 lbs so far- today is 3 weeks (on one pill a day)

## 2012-12-24 NOTE — Telephone Encounter (Signed)
Pt aware - rx up front 

## 2012-12-24 NOTE — Telephone Encounter (Signed)
rx ready for pickup 

## 2012-12-25 ENCOUNTER — Telehealth: Payer: Self-pay | Admitting: Nurse Practitioner

## 2012-12-25 NOTE — Telephone Encounter (Signed)
Pt aware of rx

## 2013-01-07 ENCOUNTER — Other Ambulatory Visit: Payer: Self-pay | Admitting: Gastroenterology

## 2013-01-20 ENCOUNTER — Telehealth: Payer: Self-pay | Admitting: Nurse Practitioner

## 2013-01-20 ENCOUNTER — Other Ambulatory Visit: Payer: Self-pay | Admitting: Gastroenterology

## 2013-01-21 MED ORDER — ESOMEPRAZOLE MAGNESIUM 40 MG PO CPDR: 40.0000 mg | DELAYED_RELEASE_CAPSULE | Freq: Two times a day (BID) | ORAL | Status: DC

## 2013-01-21 NOTE — Telephone Encounter (Signed)
PT REQUESTED REFILL ON NEXUIM. DR. Russella Dar REFUSED REFILL IN June AND july

## 2013-01-21 NOTE — Telephone Encounter (Signed)
rx sent to pharmacy

## 2013-02-04 ENCOUNTER — Encounter: Payer: Self-pay | Admitting: Nurse Practitioner

## 2013-02-04 ENCOUNTER — Other Ambulatory Visit (HOSPITAL_COMMUNITY): Payer: Self-pay | Admitting: Orthopedic Surgery

## 2013-02-04 DIAGNOSIS — M549 Dorsalgia, unspecified: Secondary | ICD-10-CM

## 2013-02-16 ENCOUNTER — Encounter (HOSPITAL_COMMUNITY)
Admission: RE | Admit: 2013-02-16 | Discharge: 2013-02-16 | Disposition: A | Discharge status: Home / Self Care | Payer: Medicaid Other | Source: Ambulatory Visit | Attending: Orthopedic Surgery | Admitting: Orthopedic Surgery

## 2013-02-16 DIAGNOSIS — M549 Dorsalgia, unspecified: Secondary | ICD-10-CM

## 2013-02-16 DIAGNOSIS — M25519 Pain in unspecified shoulder: Secondary | ICD-10-CM | POA: Insufficient documentation

## 2013-02-16 DIAGNOSIS — M545 Low back pain, unspecified: Secondary | ICD-10-CM | POA: Insufficient documentation

## 2013-02-16 MED ORDER — TECHNETIUM TC 99M MEDRONATE IV KIT
25.0000 | PACK | Freq: Once | INTRAVENOUS | Status: AC | PRN
  Administered 2013-02-16: 25 via INTRAVENOUS

## 2013-03-04 ENCOUNTER — Other Ambulatory Visit: Payer: Self-pay | Admitting: Nurse Practitioner

## 2013-03-05 NOTE — Telephone Encounter (Signed)
Last thyroids 03/14/12   MMM

## 2013-03-11 ENCOUNTER — Telehealth: Payer: Self-pay | Admitting: Nurse Practitioner

## 2013-03-12 MED ORDER — ESOMEPRAZOLE MAGNESIUM 40 MG PO CPDR: 40.0000 mg | DELAYED_RELEASE_CAPSULE | Freq: Two times a day (BID) | ORAL | Status: DC

## 2013-03-12 NOTE — Telephone Encounter (Signed)
done

## 2013-03-13 ENCOUNTER — Encounter: Payer: Self-pay | Admitting: Nurse Practitioner

## 2013-03-13 ENCOUNTER — Ambulatory Visit (INDEPENDENT_AMBULATORY_CARE_PROVIDER_SITE_OTHER): Payer: Medicaid Other | Admitting: Nurse Practitioner

## 2013-03-13 VITALS — BP 120/79 | HR 74 | Temp 97.2°F | Ht 67.0 in | Wt 257.0 lb

## 2013-03-13 DIAGNOSIS — R51 Headache: Secondary | ICD-10-CM

## 2013-03-13 DIAGNOSIS — G8929 Other chronic pain: Secondary | ICD-10-CM

## 2013-03-13 DIAGNOSIS — I10 Essential (primary) hypertension: Secondary | ICD-10-CM | POA: Insufficient documentation

## 2013-03-13 DIAGNOSIS — F411 Generalized anxiety disorder: Secondary | ICD-10-CM | POA: Insufficient documentation

## 2013-03-13 DIAGNOSIS — E039 Hypothyroidism, unspecified: Secondary | ICD-10-CM | POA: Insufficient documentation

## 2013-03-13 DIAGNOSIS — K219 Gastro-esophageal reflux disease without esophagitis: Secondary | ICD-10-CM

## 2013-03-13 DIAGNOSIS — M549 Dorsalgia, unspecified: Secondary | ICD-10-CM

## 2013-03-13 DIAGNOSIS — K589 Irritable bowel syndrome without diarrhea: Secondary | ICD-10-CM

## 2013-03-13 MED ORDER — OMEPRAZOLE 40 MG PO CPDR: 40.0000 mg | DELAYED_RELEASE_CAPSULE | Freq: Every day | ORAL | Status: DC

## 2013-03-13 MED ORDER — HYDROCODONE-ACETAMINOPHEN 5-325 MG PO TABS: 1.0000 | ORAL_TABLET | Freq: Two times a day (BID) | ORAL | Status: DC | PRN

## 2013-03-13 MED ORDER — LEVOTHYROXINE SODIUM 100 MCG PO TABS: 100.0000 ug | ORAL_TABLET | Freq: Every day | ORAL | Status: DC

## 2013-03-13 MED ORDER — HYDROCHLOROTHIAZIDE 25 MG PO TABS: 25.0000 mg | ORAL_TABLET | Freq: Every day | ORAL | Status: DC

## 2013-03-13 MED ORDER — DICYCLOMINE HCL 20 MG PO TABS: 20.0000 mg | ORAL_TABLET | Freq: Four times a day (QID) | ORAL | Status: DC

## 2013-03-13 NOTE — Patient Instructions (Signed)

## 2013-03-13 NOTE — Progress Notes (Signed)
Subjective:    Patient ID: Tasha Hays, female    DOB: 1985-12-19, 27 y.o.   MRN: [id number]  Hypertension This is a chronic problem. The current episode started more than 1 year ago. The problem is unchanged. The problem is controlled. Associated symptoms include anxiety and headaches. Pertinent negatives include no blurred vision, orthopnea, palpitations, peripheral edema or shortness of breath. There are no associated agents to hypertension. Risk factors for coronary artery disease include obesity, family history, dyslipidemia and sedentary lifestyle. Past treatments include diuretics. The current treatment provides moderate improvement. Compliance problems include diet and exercise.  Hypertensive end-organ damage includes a thyroid problem.  Thyroid Problem Visit type: Hypothyroidism. Patient reports no cold intolerance, depressed mood, diaphoresis, diarrhea, dry skin, hair loss, hoarse voice, menstrual problem, palpitations, visual change, weight gain or weight loss.  Gout No recent flare up- takes uloric daily as needed Chronic Back Pain Sees Dr. Juliene Pina- Nothing much that can be done right now- Cant't take NSAIds due to IBS and patient doesn't like how narcotics make her feel. IBS Bentyl helps- still has constipation and diarrhea. GERD Nexium works but insurance is no longer going to cover- needs something else.  Review of Systems  Constitutional: Negative for weight loss, weight gain and diaphoresis.  HENT: Negative for hoarse voice.   Eyes: Negative for blurred vision.  Respiratory: Negative for shortness of breath.   Cardiovascular: Negative for palpitations and orthopnea.  Gastrointestinal: Negative for diarrhea.  Endocrine: Negative for cold intolerance.  Genitourinary: Negative for menstrual problem.  Neurological: Positive for headaches.       Objective:   Physical Exam  Constitutional: She is oriented to person, place, and time. She appears well-developed and  well-nourished.  HENT:  Nose: Nose normal.  Mouth/Throat: Oropharynx is clear and moist.  Eyes: EOM are normal.  Neck: Trachea normal, normal range of motion and full passive range of motion without pain. Neck supple. No JVD present. Carotid bruit is not present. No thyromegaly present.  Cardiovascular: Normal rate, regular rhythm, normal heart sounds and intact distal pulses.  Exam reveals no gallop and no friction rub.   No murmur heard. Pulmonary/Chest: Effort normal and breath sounds normal.  Abdominal: Soft. Bowel sounds are normal. She exhibits no distension and no mass. There is no tenderness.  Musculoskeletal: Normal range of motion.  Decrease ROM of lumbar spine due to pain on flexion and ext.   Lymphadenopathy:    She has no cervical adenopathy.  Neurological: She is alert and oriented to person, place, and time. She has normal reflexes.  Skin: Skin is warm and dry.  Psychiatric: She has a normal mood and affect. Her behavior is normal. Judgment and thought content normal.     BP 120/79  Pulse 74  Temp(Src) 97.2 F (36.2 C) (Oral)  Ht 5\' 7"  (1.702 m)  Wt 257 lb (116.574 kg)  BMI 40.24 kg/m2      Assessment & Plan:  1. Hypertension Low NA+ diet - CMP14+EGFR - NMR, lipoprofile - hydrochlorothiazide (HYDRODIURIL) 25 MG tablet; Take 1 tablet (25 mg total) by mouth daily.  Dispense: 30 tablet; Refill: 5  2. Hypothyroidism rest - Thyroid Panel With TSH - levothyroxine (SYNTHROID, LEVOTHROID) 100 MCG tablet; Take 1 tablet (100 mcg total) by mouth daily before breakfast.  Dispense: 30 tablet; Refill: 5  3. GERD (gastroesophageal reflux disease) Avoid spicy and fatty foods Avoid eating 2 hrs prior to bedtime - omeprazole (PRILOSEC) 40 MG capsule; Take 1 capsule (40 mg total) by  mouth daily.  Dispense: 30 capsule; Refill: 3  4. GAD (generalized anxiety disorder) Stress management  5. Generalized headaches Keep diary Avoid Caffeine  6. IBS (irritable bowel  syndrome) No NSAIDS - dicyclomine (BENTYL) 20 MG tablet; Take 1 tablet (20 mg total) by mouth every 6 (six) hours.  Dispense: 120 tablet; Refill: 0  7. Chronic back pain Referral to Dr.Loke in [place] for second opinion - HYDROcodone-acetaminophen (NORCO/VICODIN) 5-325 MG per tablet; Take 1 tablet by mouth 2 (two) times daily as needed for pain.  Dispense: 60 tablet; Refill: 0  Mary-Margaret Daphine Deutscher, FNP

## 2013-03-15 LAB — CMP14+EGFR
ALT: 21 IU/L (ref 0–32)
AST: 12 IU/L (ref 0–40)
Calcium: 9.3 mg/dL (ref 8.7–10.2)
Chloride: 103 mmol/L (ref 97–108)
GFR calc Af Amer: 109 mL/min/{1.73_m2} (ref >59)
Glucose: 81 mg/dL (ref 65–99)
Potassium: 4.1 mmol/L (ref 3.5–5.2)
Sodium: 139 mmol/L (ref 134–144)
Total Bilirubin: 0.2 mg/dL (ref 0.0–1.2)
Total Protein: 7.3 g/dL (ref 6.0–8.5)

## 2013-03-15 LAB — NMR, LIPOPROFILE
LDL Particle Number: 1294 nmol/L — ABNORMAL HIGH (ref <1000)
LDL Size: 21.1 nm (ref >20.5)
Small LDL Particle Number: 285 nmol/L (ref <=527)
Triglycerides by NMR: 78 mg/dL (ref <150)

## 2013-03-15 LAB — THYROID PANEL WITH TSH
T3 Uptake Ratio: 26 % (ref 24–39)
T4, Total: 9.9 ug/dL (ref 4.5–12.0)
TSH: 4.38 u[IU]/mL (ref 0.450–4.500)

## 2013-03-18 ENCOUNTER — Telehealth: Payer: Self-pay | Admitting: Nurse Practitioner

## 2013-03-18 NOTE — Telephone Encounter (Signed)
Message for our referral dept.

## 2013-03-18 NOTE — Telephone Encounter (Signed)
Pt aware of results 

## 2013-04-08 ENCOUNTER — Other Ambulatory Visit: Payer: Self-pay | Admitting: Family Medicine

## 2013-04-16 ENCOUNTER — Other Ambulatory Visit: Payer: Self-pay | Admitting: Neurosurgery

## 2013-04-16 ENCOUNTER — Other Ambulatory Visit: Payer: Self-pay | Admitting: Nurse Practitioner

## 2013-04-16 DIAGNOSIS — M545 Low back pain, unspecified: Secondary | ICD-10-CM

## 2013-04-16 DIAGNOSIS — IMO0002 Reserved for concepts with insufficient information to code with codable children: Secondary | ICD-10-CM

## 2013-04-16 DIAGNOSIS — M79605 Pain in left leg: Secondary | ICD-10-CM

## 2013-04-17 ENCOUNTER — Encounter: Payer: Self-pay | Admitting: Nurse Practitioner

## 2013-04-21 ENCOUNTER — Telehealth: Payer: Self-pay | Admitting: Nurse Practitioner

## 2013-04-22 NOTE — Telephone Encounter (Signed)
appt scheduled

## 2013-04-25 ENCOUNTER — Ambulatory Visit
Admission: RE | Admit: 2013-04-25 | Discharge: 2013-04-25 | Disposition: A | Discharge status: Home / Self Care | Payer: Medicaid Other | Source: Ambulatory Visit | Attending: Neurosurgery | Admitting: Neurosurgery

## 2013-04-25 ENCOUNTER — Other Ambulatory Visit: Payer: Self-pay | Admitting: Neurosurgery

## 2013-04-25 DIAGNOSIS — M79605 Pain in left leg: Secondary | ICD-10-CM

## 2013-04-25 DIAGNOSIS — IMO0002 Reserved for concepts with insufficient information to code with codable children: Secondary | ICD-10-CM

## 2013-04-25 DIAGNOSIS — M545 Low back pain, unspecified: Secondary | ICD-10-CM

## 2013-04-25 NOTE — Progress Notes (Signed)
Tasha Hays came to MRI suite stating that she had been stuck 25 times at [facility] and multiple times at [facility] for a contrasted MRI, none of which were successful.  After one attempt at a vein in her hand, she asked me how I stuck her because it never hurt like that before.  I told her I didn't understand the question and that I got the needle in her vein and it blew.  I told her that if she was such a difficult stick, it would be better to come back when there would be a nurse to try.  After expressing that I saw another vein but did not feel too confident about it, she refused any more attempts and left with no post contrast images.

## 2013-04-28 ENCOUNTER — Other Ambulatory Visit: Payer: Self-pay | Admitting: Neurosurgery

## 2013-04-28 DIAGNOSIS — M545 Low back pain: Secondary | ICD-10-CM

## 2013-04-29 ENCOUNTER — Ambulatory Visit: Payer: Medicaid Other | Admitting: Nurse Practitioner

## 2013-05-01 ENCOUNTER — Telehealth: Payer: Self-pay | Admitting: *Deleted

## 2013-05-01 NOTE — Telephone Encounter (Signed)
Patient stayes tired all the time what do you suggest

## 2013-05-01 NOTE — Telephone Encounter (Signed)
Should be fine.

## 2013-05-01 NOTE — Telephone Encounter (Signed)
PT WANTS TO KNOW IF SHE CAN TAKE GARICNIA CAMBOGIA, WITH ALL OF HER OTHER MEDS? PLEASE ADDRESS AND CALL PT ON CELL

## 2013-05-01 NOTE — Telephone Encounter (Signed)
Please advise 

## 2013-05-01 NOTE — Telephone Encounter (Signed)
Most recnet labs looed good- try to start exercising to u=increase energy and take a daily multivitamin

## 2013-05-01 NOTE — Telephone Encounter (Signed)
Patient aware.

## 2013-05-05 ENCOUNTER — Ambulatory Visit (INDEPENDENT_AMBULATORY_CARE_PROVIDER_SITE_OTHER): Payer: Medicaid Other

## 2013-05-05 DIAGNOSIS — Z23 Encounter for immunization: Secondary | ICD-10-CM

## 2013-05-07 ENCOUNTER — Other Ambulatory Visit: Payer: Medicaid Other

## 2013-05-07 NOTE — Telephone Encounter (Signed)
Tried calling patient but the number would not go thru

## 2013-05-13 NOTE — Telephone Encounter (Signed)
Number is not working have tried multiple times

## 2013-05-20 ENCOUNTER — Other Ambulatory Visit (HOSPITAL_COMMUNITY): Payer: Self-pay | Admitting: Neurosurgery

## 2013-05-20 DIAGNOSIS — M545 Low back pain: Secondary | ICD-10-CM

## 2013-05-21 ENCOUNTER — Telehealth: Payer: Self-pay | Admitting: Nurse Practitioner

## 2013-05-21 DIAGNOSIS — M797 Fibromyalgia: Secondary | ICD-10-CM

## 2013-05-21 NOTE — Telephone Encounter (Signed)
Patient called and had her records sent from [facility] office for a referral / needs one for a pain clinic closer by.  Records are in sleeve, ask Rolm Bookbinder or Lupita Dawn about them

## 2013-05-21 NOTE — Telephone Encounter (Signed)
Referral made 

## 2013-05-25 ENCOUNTER — Ambulatory Visit (HOSPITAL_COMMUNITY)
Admission: RE | Admit: 2013-05-25 | Discharge: 2013-05-25 | Disposition: A | Discharge status: Home / Self Care | Payer: Medicaid Other | Source: Ambulatory Visit | Attending: Neurosurgery | Admitting: Neurosurgery

## 2013-05-25 ENCOUNTER — Telehealth: Payer: Self-pay | Admitting: Nurse Practitioner

## 2013-05-25 DIAGNOSIS — M545 Low back pain, unspecified: Secondary | ICD-10-CM | POA: Insufficient documentation

## 2013-05-25 DIAGNOSIS — M79609 Pain in unspecified limb: Secondary | ICD-10-CM | POA: Insufficient documentation

## 2013-05-25 MED ORDER — GADOBENATE DIMEGLUMINE 529 MG/ML IV SOLN
20.0000 mL | Freq: Once | INTRAVENOUS | Status: AC | PRN
  Administered 2013-05-25: 20 mL via INTRAVENOUS

## 2013-05-25 NOTE — Telephone Encounter (Signed)
Referral has already been made- takes [facility]

## 2013-05-26 NOTE — Telephone Encounter (Signed)
Patient aware.

## 2013-05-29 ENCOUNTER — Encounter: Payer: Self-pay | Admitting: Physical Medicine & Rehabilitation

## 2013-06-09 ENCOUNTER — Ambulatory Visit (INDEPENDENT_AMBULATORY_CARE_PROVIDER_SITE_OTHER): Payer: Medicaid Other

## 2013-06-09 ENCOUNTER — Encounter: Payer: Self-pay | Admitting: Nurse Practitioner

## 2013-06-09 ENCOUNTER — Ambulatory Visit (INDEPENDENT_AMBULATORY_CARE_PROVIDER_SITE_OTHER): Payer: Medicaid Other | Admitting: Nurse Practitioner

## 2013-06-09 VITALS — BP 113/76 | HR 85 | Temp 97.8°F | Ht 67.0 in | Wt 288.0 lb

## 2013-06-09 DIAGNOSIS — S60229A Contusion of unspecified hand, initial encounter: Secondary | ICD-10-CM

## 2013-06-09 DIAGNOSIS — S60222A Contusion of left hand, initial encounter: Secondary | ICD-10-CM

## 2013-06-09 DIAGNOSIS — S6990XA Unspecified injury of unspecified wrist, hand and finger(s), initial encounter: Secondary | ICD-10-CM

## 2013-06-09 DIAGNOSIS — M797 Fibromyalgia: Secondary | ICD-10-CM

## 2013-06-09 DIAGNOSIS — IMO0001 Reserved for inherently not codable concepts without codable children: Secondary | ICD-10-CM

## 2013-06-09 DIAGNOSIS — S6992XA Unspecified injury of left wrist, hand and finger(s), initial encounter: Secondary | ICD-10-CM

## 2013-06-09 DIAGNOSIS — R5381 Other malaise: Secondary | ICD-10-CM

## 2013-06-09 DIAGNOSIS — G894 Chronic pain syndrome: Secondary | ICD-10-CM

## 2013-06-09 DIAGNOSIS — R5383 Other fatigue: Secondary | ICD-10-CM

## 2013-06-09 MED ORDER — TRAMADOL HCL 50 MG PO TABS: 50.0000 mg | ORAL_TABLET | Freq: Two times a day (BID) | ORAL | Status: DC
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
## 2013-06-09 NOTE — Patient Instructions (Signed)
Pain of Unknown Etiology (Pain Without a Known Cause) °You have come to your caregiver because of pain. Pain can occur in any part of the body. Often there is not a definite cause. If your laboratory (blood or urine) work was normal and x-rays or other studies were normal, your caregiver may treat you without knowing the cause of the pain. An example of this is the headache. Most headaches are diagnosed by taking a history. This means your caregiver asks you questions about your headaches. Your caregiver determines a treatment based on your answers. Usually testing done for headaches is normal. Often testing is not done unless there is no response to medications. Regardless of where your pain is located today, you can be given medications to make you comfortable. If no physical cause of pain can be found, most cases of pain will gradually leave as suddenly as they came.  °If you have a painful condition and no reason can be found for the pain, It is importantthat you follow up with your caregiver. If the pain becomes worse or does not go away, it may be necessary to repeat tests and look further for a possible cause. °· Only take over-the-counter or prescription medicines for pain, discomfort, or fever as directed by your caregiver. °· For the protection of your privacy, test results can not be given over the phone. Make sure you receive the results of your test. Ask as to how these results are to be obtained if you have not been informed. It is your responsibility to obtain your test results. °· You may continue all activities unless the activities cause more pain. When the pain lessens, it is important to gradually resume normal activities. Resume activities by beginning slowly and gradually increasing the intensity and duration of the activities or exercise. During periods of severe pain, bed-rest may be helpful. Lay or sit in any position that is comfortable. °· Ice used for acute (sudden) conditions may be  effective. Use a large plastic bag filled with ice and wrapped in a towel. This may provide pain relief. °· See your caregiver for continued problems. They can help or refer you for exercises or physical therapy if necessary. °If you were given medications for your condition, do not drive, operate machinery or power tools, or sign legal documents for 24 hours. Do not drink alcohol, take sleeping pills, or take other medications that may interfere with treatment. °See your caregiver immediately if you have pain that is becoming worse and not relieved by medications. °Document Released: 03/27/2001 Document Revised: 09/24/2011 Document Reviewed: 07/02/2005 °Exit[employer]® Patient Information ©2014 [employer]. ° °

## 2013-07-22 ENCOUNTER — Encounter: Payer: Medicaid Other | Attending: Physical Medicine & Rehabilitation | Admitting: Physical Medicine & Rehabilitation

## 2013-07-22 ENCOUNTER — Encounter: Payer: Self-pay | Admitting: Physical Medicine & Rehabilitation

## 2013-07-22 ENCOUNTER — Ambulatory Visit
Admission: RE | Admit: 2013-07-22 | Discharge: 2013-07-22 | Disposition: A | Discharge status: Home / Self Care | Payer: Medicaid Other | Source: Ambulatory Visit | Attending: Physical Medicine & Rehabilitation | Admitting: Physical Medicine & Rehabilitation

## 2013-07-22 VITALS — BP 128/73 | HR 77 | Resp 14 | Ht 65.0 in | Wt 291.0 lb

## 2013-07-22 DIAGNOSIS — M7918 Myalgia, other site: Secondary | ICD-10-CM | POA: Insufficient documentation

## 2013-07-22 DIAGNOSIS — K589 Irritable bowel syndrome without diarrhea: Secondary | ICD-10-CM | POA: Insufficient documentation

## 2013-07-22 DIAGNOSIS — IMO0001 Reserved for inherently not codable concepts without codable children: Secondary | ICD-10-CM

## 2013-07-22 DIAGNOSIS — E039 Hypothyroidism, unspecified: Secondary | ICD-10-CM | POA: Insufficient documentation

## 2013-07-22 DIAGNOSIS — K2100 Gastro-esophageal reflux disease with esophagitis, without bleeding: Secondary | ICD-10-CM

## 2013-07-22 DIAGNOSIS — M546 Pain in thoracic spine: Secondary | ICD-10-CM

## 2013-07-22 DIAGNOSIS — M533 Sacrococcygeal disorders, not elsewhere classified: Secondary | ICD-10-CM | POA: Insufficient documentation

## 2013-07-22 DIAGNOSIS — G8929 Other chronic pain: Secondary | ICD-10-CM | POA: Insufficient documentation

## 2013-07-22 DIAGNOSIS — M109 Gout, unspecified: Secondary | ICD-10-CM | POA: Insufficient documentation

## 2013-07-22 DIAGNOSIS — M542 Cervicalgia: Secondary | ICD-10-CM

## 2013-07-22 DIAGNOSIS — F329 Major depressive disorder, single episode, unspecified: Secondary | ICD-10-CM | POA: Insufficient documentation

## 2013-07-22 DIAGNOSIS — M961 Postlaminectomy syndrome, not elsewhere classified: Secondary | ICD-10-CM | POA: Insufficient documentation

## 2013-07-22 DIAGNOSIS — F3289 Other specified depressive episodes: Secondary | ICD-10-CM | POA: Insufficient documentation

## 2013-07-22 DIAGNOSIS — M1009 Idiopathic gout, multiple sites: Secondary | ICD-10-CM | POA: Insufficient documentation

## 2013-07-22 DIAGNOSIS — K219 Gastro-esophageal reflux disease without esophagitis: Secondary | ICD-10-CM | POA: Insufficient documentation

## 2013-07-22 MED ORDER — TIZANIDINE HCL 2 MG PO CAPS: 2.0000 mg | ORAL_CAPSULE | Freq: Three times a day (TID) | ORAL | Status: DC | PRN

## 2013-07-22 MED ORDER — CELECOXIB 100 MG PO CAPS: 100.0000 mg | ORAL_CAPSULE | Freq: Every day | ORAL | Status: DC
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
## 2013-07-22 NOTE — Patient Instructions (Signed)
Tailbone Injury The tailbone (coccyx) is the small bone at the lower end of the spine. A tailbone injury may involve stretched ligaments, bruising, or a broken bone (fracture). Women are more vulnerable to this injury due to having a wider pelvis. CAUSES  This type of injury typically occurs from falling and landing on the tailbone. Repeated strain or friction from actions such as rowing and bicycling may also injure the area. The tailbone can be injured durLow Back Strain with Rehab A strain is an injury in which a tendon or muscle is torn. The muscles and tendons of the lower back are vulnerable to strains. However, these muscles and tendons are very strong and require a great force to be injured. Strains are classified into three categories. Grade 1 strains cause pain, but the tendon is not lengthened. Grade 2 strains include a lengthened ligament, due to the ligament being stretched or partially ruptured. With grade 2 strains there is still function, although the function may be decreased. Grade 3 strains involve a complete tear of the tendon or muscle, and function is usually impaired. SYMPTOMS   Pain in the lower back.  Pain that affects one side more than the other.  Pain that gets worse with movement and may be felt in the hip, buttocks, or back of the thigh.  Muscle spasms of the muscles in the back.  Swelling along the muscles of the back.  Loss of strength of the back muscles.  Crackling sound (crepitation) when the muscles are touched. CAUSES  Lower back strains occur when a force is placed on the muscles or tendons that is greater than they can handle. Common causes of injury include:  Prolonged overuse of the muscle-tendon units in the lower back, usually from incorrect posture.  A single violent injury or force applied to the back. RISK INCREASES WITH:  Sports that involve twisting forces on the spine or a lot of bending at the waist (football, rugby, weightlifting,  bowling, golf, tennis, speed skating, racquetball, swimming, running, gymnastics, diving).  Poor strength and flexibility.  Failure to warm up properly before activity.  Family history of lower back pain or disk disorders.  Previous back injury or surgery (especially fusion).  Poor posture with lifting, especially heavy objects.  Prolonged sitting, especially with poor posture. PREVENTION   Learn and use proper posture when sitting or lifting (maintain proper posture when sitting, lift using the knees and legs, not at the waist).  Warm up and stretch properly before activity.  Allow for adequate recovery between workouts.  Maintain physical fitness:  Strength, flexibility, and endurance.  Cardiovascular fitness. PROGNOSIS  If treated properly, lower back strains usually heal within 6 weeks. RELATED COMPLICATIONS   Recurring symptoms, resulting in a chronic problem.  Chronic inflammation, scarring, and partial muscle-tendon tear.  Delayed healing or resolution of symptoms.  Prolonged disability. TREATMENT  Treatment first involves the use of ice and medicine, to reduce pain and inflammation. The use of strengthening and stretching exercises may help reduce pain with activity. These exercises may be performed at home or with a therapist. Severe injuries may require referral to a therapist for further evaluation and treatment, such as ultrasound. Your caregiver may advise that you wear a back brace or corset, to help reduce pain and discomfort. Often, prolonged bed rest results in greater harm then benefit. Corticosteroid injections may be recommended. However, these should be reserved for the most serious cases. It is important to avoid using your back when lifting objects.  At night, sleep on your back on a firm mattress with a pillow placed under your knees. If non-surgical treatment is unsuccessful, surgery may be needed.  MEDICATION   If pain medicine is needed, nonsteroidal  anti-inflammatory medicines (aspirin and ibuprofen), or other minor pain relievers (acetaminophen), are often advised.  Do not take pain medicine for 7 days before surgery.  Prescription pain relievers may be given, if your caregiver thinks they are needed. Use only as directed and only as much as you need.  Ointments applied to the skin may be helpful.  Corticosteroid injections may be given by your caregiver. These injections should be reserved for the most serious cases, because they may only be given a certain number of times. HEAT AND COLD  Cold treatment (icing) should be applied for 10 to 15 minutes every 2 to 3 hours for inflammation and pain, and immediately after activity that aggravates your symptoms. Use ice packs or an ice massage.  Heat treatment may be used before performing stretching and strengthening activities prescribed by your caregiver, physical therapist, or athletic trainer. Use a heat pack or a warm water soak. SEEK MEDICAL CARE IF:   Symptoms get worse or do not improve in 2 to 4 weeks, despite treatment.  You develop numbness, weakness, or loss of bowel or bladder function.  New, unexplained symptoms develop. (Drugs used in treatment may produce side effects.) EXERCISES  RANGE OF MOTION (ROM) AND STRETCHING EXERCISES - Low Back Strain Most people with lower back pain will find that their symptoms get worse with excessive bending forward (flexion) or arching at the lower back (extension). The exercises which will help resolve your symptoms will focus on the opposite motion.  Your physician, physical therapist or athletic trainer will help you determine which exercises will be most helpful to resolve your lower back pain. Do not complete any exercises without first consulting with your caregiver. Discontinue any exercises which make your symptoms worse until you speak to your caregiver.  If you have pain, numbness or tingling which travels down into your buttocks,  leg or foot, the goal of the therapy is for these symptoms to move closer to your back and eventually resolve. Sometimes, these leg symptoms will get better, but your lower back pain may worsen. This is typically an indication of progress in your rehabilitation. Be very alert to any changes in your symptoms and the activities in which you participated in the 24 hours prior to the change. Sharing this information with your caregiver will allow him/her to most efficiently treat your condition.  These exercises may help you when beginning to rehabilitate your injury. Your symptoms may resolve with or without further involvement from your physician, physical therapist or athletic trainer. While completing these exercises, remember:  Restoring tissue flexibility helps normal motion to return to the joints. This allows healthier, less painful movement and activity.  An effective stretch should be held for at least 30 seconds.  A stretch should never be painful. You should only feel a gentle lengthening or release in the stretched tissue. FLEXION RANGE OF MOTION AND STRETCHING EXERCISES: STRETCH  Flexion, Single Knee to Chest   Lie on a firm bed or floor with both legs extended in front of you.  Keeping one leg in contact with the floor, bring your opposite knee to your chest. Hold your leg in place by either grabbing behind your thigh or at your knee.  Pull until you feel a gentle stretch in your lower back.  Hold __________ seconds.  Slowly release your grasp and repeat the exercise with the opposite side. Repeat __________ times. Complete this exercise __________ times per day.  STRETCH  Flexion, Double Knee to Chest   Lie on a firm bed or floor with both legs extended in front of you.  Keeping one leg in contact with the floor, bring your opposite knee to your chest.  Tense your stomach muscles to support your back and then lift your other knee to your chest. Hold your legs in place by either  grabbing behind your thighs or at your knees.  Pull both knees toward your chest until you feel a gentle stretch in your lower back. Hold __________ seconds.  Tense your stomach muscles and slowly return one leg at a time to the floor. Repeat __________ times. Complete this exercise __________ times per day.  STRETCH  Low Trunk Rotation  Lie on a firm bed or floor. Keeping your legs in front of you, bend your knees so they are both pointed toward the ceiling and your feet are flat on the floor.  Extend your arms out to the side. This will stabilize your upper body by keeping your shoulders in contact with the floor.  Gently and slowly drop both knees together to one side until you feel a gentle stretch in your lower back. Hold for __________ seconds.  Tense your stomach muscles to support your lower back as you bring your knees back to the starting position. Repeat the exercise to the other side. Repeat __________ times. Complete this exercise __________ times per day  EXTENSION RANGE OF MOTION AND FLEXIBILITY EXERCISES: STRETCH  Extension, Prone on Elbows   Lie on your stomach on the floor, a bed will be too soft. Place your palms about shoulder width apart and at the height of your head.  Place your elbows under your shoulders. If this is too painful, stack pillows under your chest.  Allow your body to relax so that your hips drop lower and make contact more completely with the floor.  Hold this position for __________ seconds.  Slowly return to lying flat on the floor. Repeat __________ times. Complete this exercise __________ times per day.  RANGE OF MOTION  Extension, Prone Press Ups  Lie on your stomach on the floor, a bed will be too soft. Place your palms about shoulder width apart and at the height of your head.  Keeping your back as relaxed as possible, slowly straighten your elbows while keeping your hips on the floor. You may adjust the placement of your hands to maximize  your comfort. As you gain motion, your hands will come more underneath your shoulders.  Hold this position __________ seconds.  Slowly return to lying flat on the floor. Repeat __________ times. Complete this exercise __________ times per day.  RANGE OF MOTION- Quadruped, Neutral Spine   Assume a hands and knees position on a firm surface. Keep your hands under your shoulders and your knees under your hips. You may place padding under your knees for comfort.  Drop your head and point your tail bone toward the ground below you. This will round out your lower back like an angry cat. Hold this position for __________ seconds.  Slowly lift your head and release your tail bone so that your back sags into a large arch, like an old horse.  Hold this position for __________ seconds.  Repeat this until you feel limber in your lower back.  Now, find your "  sweet spot." This will be the most comfortable position somewhere between the two previous positions. This is your neutral spine. Once you have found this position, tense your stomach muscles to support your lower back.  Hold this position for __________ seconds. Repeat __________ times. Complete this exercise __________ times per day.  STRENGTHENING EXERCISES - Low Back Strain These exercises may help you when beginning to rehabilitate your injury. These exercises should be done near your "sweet spot." This is the neutral, low-back arch, somewhere between fully rounded and fully arched, that is your least painful position. When performed in this safe range of motion, these exercises can be used for people who have either a flexion or extension based injury. These exercises may resolve your symptoms with or without further involvement from your physician, physical therapist or athletic trainer. While completing these exercises, remember:   Muscles can gain both the endurance and the strength needed for everyday activities through controlled  exercises.  Complete these exercises as instructed by your physician, physical therapist or athletic trainer. Increase the resistance and repetitions only as guided.  You may experience muscle soreness or fatigue, but the pain or discomfort you are trying to eliminate should never worsen during these exercises. If this pain does worsen, stop and make certain you are following the directions exactly. If the pain is still present after adjustments, discontinue the exercise until you can discuss the trouble with your caregiver. STRENGTHENING Deep Abdominals, Pelvic Tilt  Lie on a firm bed or floor. Keeping your legs in front of you, bend your knees so they are both pointed toward the ceiling and your feet are flat on the floor.  Tense your lower abdominal muscles to press your lower back into the floor. This motion will rotate your pelvis so that your tail bone is scooping upwards rather than pointing at your feet or into the floor.  With a gentle tension and even breathing, hold this position for __________ seconds. Repeat __________ times. Complete this exercise __________ times per day.  STRENGTHENING  Abdominals, Crunches   Lie on a firm bed or floor. Keeping your legs in front of you, bend your knees so they are both pointed toward the ceiling and your feet are flat on the floor. Cross your arms over your chest.  Slightly tip your chin down without bending your neck.  Tense your abdominals and slowly lift your trunk high enough to just clear your shoulder blades. Lifting higher can put excessive stress on the lower back and does not further strengthen your abdominal muscles.  Control your return to the starting position. Repeat __________ times. Complete this exercise __________ times per day.  STRENGTHENING  Quadruped, Opposite UE/LE Lift   Assume a hands and knees position on a firm surface. Keep your hands under your shoulders and your knees under your hips. You may place padding under  your knees for comfort.  Find your neutral spine and gently tense your abdominal muscles so that you can maintain this position. Your shoulders and hips should form a rectangle that is parallel with the floor and is not twisted.  Keeping your trunk steady, lift your right hand no higher than your shoulder and then your left leg no higher than your hip. Make sure you are not holding your breath. Hold this position __________ seconds.  Continuing to keep your abdominal muscles tense and your back steady, slowly return to your starting position. Repeat with the opposite arm and leg. Repeat __________ times. Complete this  exercise __________ times per day.  STRENGTHENING  Lower Abdominals, Double Knee Lift  Lie on a firm bed or floor. Keeping your legs in front of you, bend your knees so they are both pointed toward the ceiling and your feet are flat on the floor.  Tense your abdominal muscles to brace your lower back and slowly lift both of your knees until they come over your hips. Be certain not to hold your breath.  Hold __________ seconds. Using your abdominal muscles, return to the starting position in a slow and controlled manner. Repeat __________ times. Complete this exercise __________ times per day.  POSTURE AND BODY MECHANICS CONSIDERATIONS - Low Back Strain Keeping correct posture when sitting, standing or completing your activities will reduce the stress put on different body tissues, allowing injured tissues a chance to heal and limiting painful experiences. The following are general guidelines for improved posture. Your physician or physical therapist will provide you with any instructions specific to your needs. While reading these guidelines, remember:  The exercises prescribed by your provider will help you have the flexibility and strength to maintain correct postures.  The correct posture provides the best environment for your joints to work. All of your joints have less wear  and tear when properly supported by a spine with good posture. This means you will experience a healthier, less painful body.  Correct posture must be practiced with all of your activities, especially prolonged sitting and standing. Correct posture is as important when doing repetitive low-stress activities (typing) as it is when doing a single heavy-load activity (lifting). RESTING POSITIONS Consider which positions are most painful for you when choosing a resting position. If you have pain with flexion-based activities (sitting, bending, stooping, squatting), choose a position that allows you to rest in a less flexed posture. You would want to avoid curling into a fetal position on your side. If your pain worsens with extension-based activities (prolonged standing, working overhead), avoid resting in an extended position such as sleeping on your stomach. Most people will find more comfort when they rest with their spine in a more neutral position, neither too rounded nor too arched. Lying on a non-sagging bed on your side with a pillow between your knees, or on your back with a pillow under your knees will often provide some relief. Keep in mind, being in any one position for a prolonged period of time, no matter how correct your posture, can still lead to stiffness. PROPER SITTING POSTURE In order to minimize stress and discomfort on your spine, you must sit with correct posture. Sitting with good posture should be effortless for a healthy body. Returning to good posture is a gradual process. Many people can work toward this most comfortably by using various supports until they have the flexibility and strength to maintain this posture on their own. When sitting with proper posture, your ears will fall over your shoulders and your shoulders will fall over your hips. You should use the back of the chair to support your upper back. Your lower back will be in a neutral position, just slightly arched. You may  place a small pillow or folded towel at the base of your lower back for support.  When working at a desk, create an environment that supports good, upright posture. Without extra support, muscles tire, which leads to excessive strain on joints and other tissues. Keep these recommendations in mind: CHAIR:  A chair should be able to slide under your desk when your  back makes contact with the back of the chair. This allows you to work closely.  The chair's height should allow your eyes to be level with the upper part of your monitor and your hands to be slightly lower than your elbows. BODY POSITION  Your feet should make contact with the floor. If this is not possible, use a foot rest.  Keep your ears over your shoulders. This will reduce stress on your neck and lower back. INCORRECT SITTING POSTURES  If you are feeling tired and unable to assume a healthy sitting posture, do not slouch or slump. This puts excessive strain on your back tissues, causing more damage and pain. Healthier options include:  Using more support, like a lumbar pillow.  Switching tasks to something that requires you to be upright or walking.  Talking a brief walk.  Lying down to rest in a neutral-spine position. PROLONGED STANDING WHILE SLIGHTLY LEANING FORWARD  When completing a task that requires you to lean forward while standing in one place for a long time, place either foot up on a stationary 2-4 inch high object to help maintain the best posture. When both feet are on the ground, the lower back tends to lose its slight inward curve. If this curve flattens (or becomes too large), then the back and your other joints will experience too much stress, tire more quickly, and can cause pain. CORRECT STANDING POSTURES Proper standing posture should be assumed with all daily activities, even if they only take a few moments, like when brushing your teeth. As in sitting, your ears should fall over your shoulders and your  shoulders should fall over your hips. You should keep a slight tension in your abdominal muscles to brace your spine. Your tailbone should point down to the ground, not behind your body, resulting in an over-extended swayback posture.  INCORRECT STANDING POSTURES  Common incorrect standing postures include a forward head, locked knees and/or an excessive swayback. WALKING Walk with an upright posture. Your ears, shoulders and hips should all line-up. PROLONGED ACTIVITY IN A FLEXED POSITION When completing a task that requires you to bend forward at your waist or lean over a low surface, try to find a way to stabilize 3 out of 4 of your limbs. You can place a hand or elbow on your thigh or rest a knee on the surface you are reaching across. This will provide you more stability so that your muscles do not fatigue as quickly. By keeping your knees relaxed, or slightly bent, you will also reduce stress across your lower back. CORRECT LIFTING TECHNIQUES DO :   Assume a wide stance. This will provide you more stability and the opportunity to get as close as possible to the object which you are lifting.  Tense your abdominals to brace your spine. Bend at the knees and hips. Keeping your back locked in a neutral-spine position, lift using your leg muscles. Lift with your legs, keeping your back straight.  Test the weight of unknown objects before attempting to lift them.  Try to keep your elbows locked down at your sides in order get the best strength from your shoulders when carrying an object.  Always ask for help when lifting heavy or awkward objects. INCORRECT LIFTING TECHNIQUES DO NOT:   Lock your knees when lifting, even if it is a small object.  Bend and twist. Pivot at your feet or move your feet when needing to change directions.  Assume that you can safely pick  up even a paper clip without proper posture. Document Released: 07/02/2005 Document Revised: 09/24/2011 Document Reviewed:  10/14/2008 [employer] Patient Information 2014 [place], [place]. ing childbirth. Infections or tumors may also press on the tailbone and cause pain. Sometimes, the cause of injury is unknown. SYMPTOMS   Bruising.  Pain when sitting.  Painful bowel movements.  In women, pain during intercourse. DIAGNOSIS  Your caregiver can diagnose a tailbone injury based on your symptoms and a physical exam. X-rays may be taken if a fracture is suspected. Your caregiver may also use an MRI scan imaging test to evaluate your symptoms. TREATMENT  Your caregiver may prescribe medicines to help relieve your pain. Most tailbone injuries heal on their own in 4 to 6 weeks. However, if the injury is caused by an infection or tumor, the recovery period may vary. PREVENTION  Wear appropriate padding and sports gear when bicycling and rowing. This can help prevent an injury from repeated strain or friction. HOME CARE INSTRUCTIONS   Put ice on the injured area.  Put ice in a plastic bag.  Place a towel between your skin and the bag.  Leave the ice on for 15-20 minutes, every hour while awake for the first 1 to 2 days.  Sit on a large, rubber or inflated ring or cushion to ease your pain. Lean forward when sitting to help decrease discomfort.  Avoid sitting for long periods of time.  Increase your activity as the pain allows.  Only take over-the-counter or prescription medicines for pain, discomfort, or fever as directed by your caregiver.  You may use stool softeners if it is painful to have a bowel movement, or as directed by your caregiver.  Eat a diet with plenty of fiber to help prevent constipation.  Keep all follow-up appointments as directed by your caregiver. SEEK MEDICAL CARE IF:   Your pain becomes worse.  Your bowel movements cause a great deal of discomfort.  You are unable to have a bowel movement.  You have a fever. MAKE SURE YOU:  Understand these instructions.  Will watch your  condition.  Will get help right away if you are not doing well or get worse. Document Released: 06/29/2000 Document Revised: 09/24/2011 Document Reviewed: 01/25/2011 [facility] Patient Information 2014 [place], [place].

## 2013-07-24 ENCOUNTER — Telehealth: Payer: Self-pay

## 2013-07-24 DIAGNOSIS — M7918 Myalgia, other site: Secondary | ICD-10-CM

## 2013-07-24 DIAGNOSIS — M961 Postlaminectomy syndrome, not elsewhere classified: Secondary | ICD-10-CM

## 2013-07-24 NOTE — Telephone Encounter (Signed)
Xrays are unremarkable. 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 Can make a therapy referral.  I don't think she has insurance. [facility] [address]

## 2013-07-24 NOTE — Telephone Encounter (Signed)
Referral made 

## 2013-07-24 NOTE — Telephone Encounter (Signed)
Patient informed of xray results.  [facility]

## 2013-07-24 NOTE — Telephone Encounter (Signed)
Patient called requesting xray results.

## 2013-07-25 ENCOUNTER — Other Ambulatory Visit: Payer: Self-pay | Admitting: Nurse Practitioner

## 2013-07-27 ENCOUNTER — Other Ambulatory Visit: Payer: Self-pay | Admitting: Nurse Practitioner

## 2013-07-27 DIAGNOSIS — K589 Irritable bowel syndrome without diarrhea: Secondary | ICD-10-CM

## 2013-07-27 MED ORDER — DICYCLOMINE HCL 20 MG PO TABS: 20.0000 mg | ORAL_TABLET | Freq: Four times a day (QID) | ORAL | Status: DC

## 2013-07-31 ENCOUNTER — Telehealth: Payer: Self-pay | Admitting: *Deleted

## 2013-07-31 NOTE — Telephone Encounter (Signed)
Calling about PT referral.  Has not heard anything and is anxious to get started.  It was made on 07/24/13

## 2013-08-02 IMAGING — CR DG CHEST 2V
2 series · 2 of 2 positions shown · non-contrast
Comparison: None.

CLINICAL DATA: Preop for L5-S1 lumbar spine surgery.

CHEST - 2 VIEW

[w chest pa]
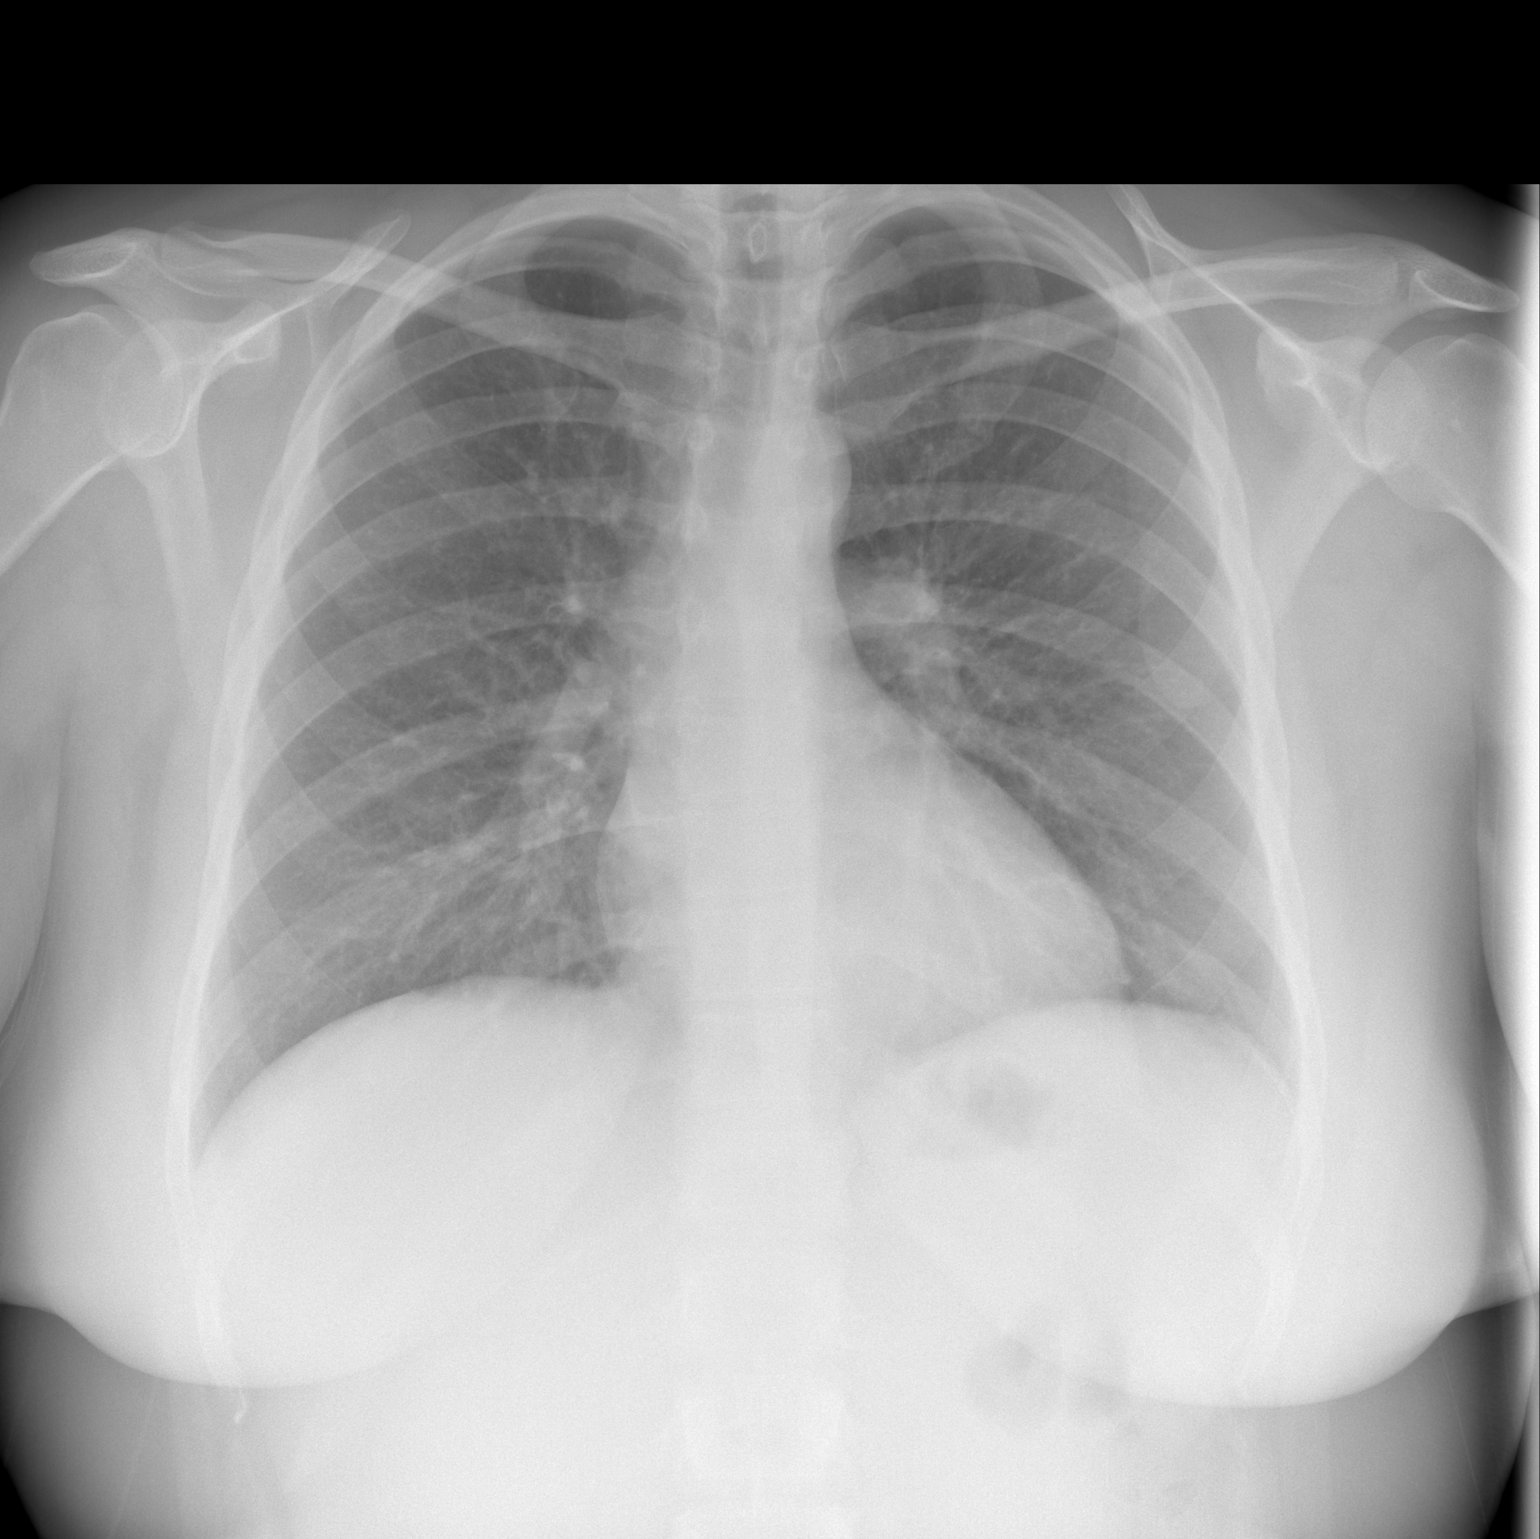

[w chest lat]
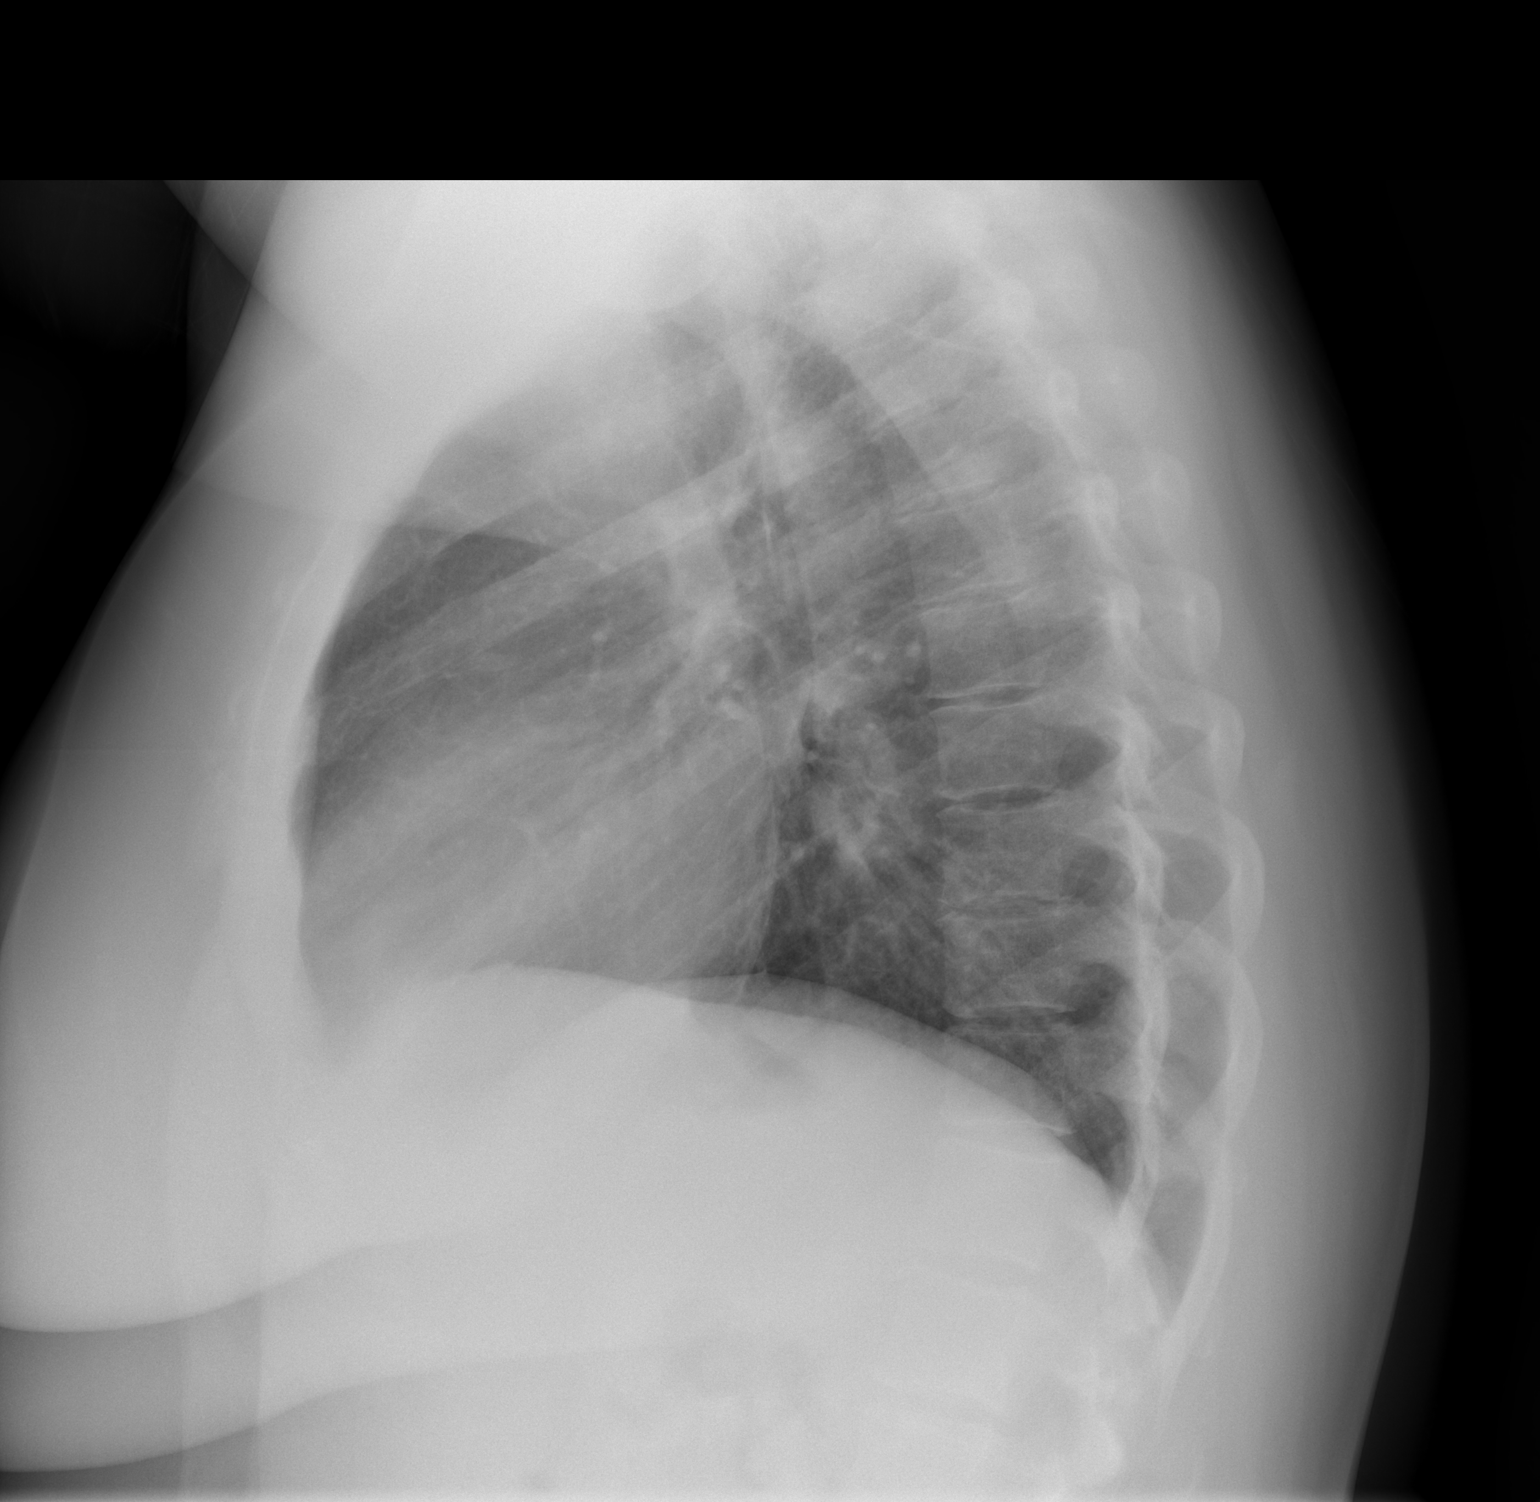

[2 of 2 positions shown; findings below may reference images not displayed]

FINDINGS: Lateral view degraded by patient arm position.

Midline trachea.  Normal heart size and mediastinal contours. No
pleural effusion or pneumothorax.  Clear lungs.
IMPRESSION: Normal chest.

## 2013-08-02 IMAGING — CR DG LUMBAR SPINE 2-3V
2 series · 2 of 2 positions shown · non-contrast
Comparison: Abdominal pelvic CT of 07/05/2011.  Nerlande Krantz..

CLINICAL DATA: Preop for herniated L5-S1 disc.  Low back pain and
left leg pain with numbness.

LUMBAR SPINE - 2-3 VIEW

[t l-spine a.p.]
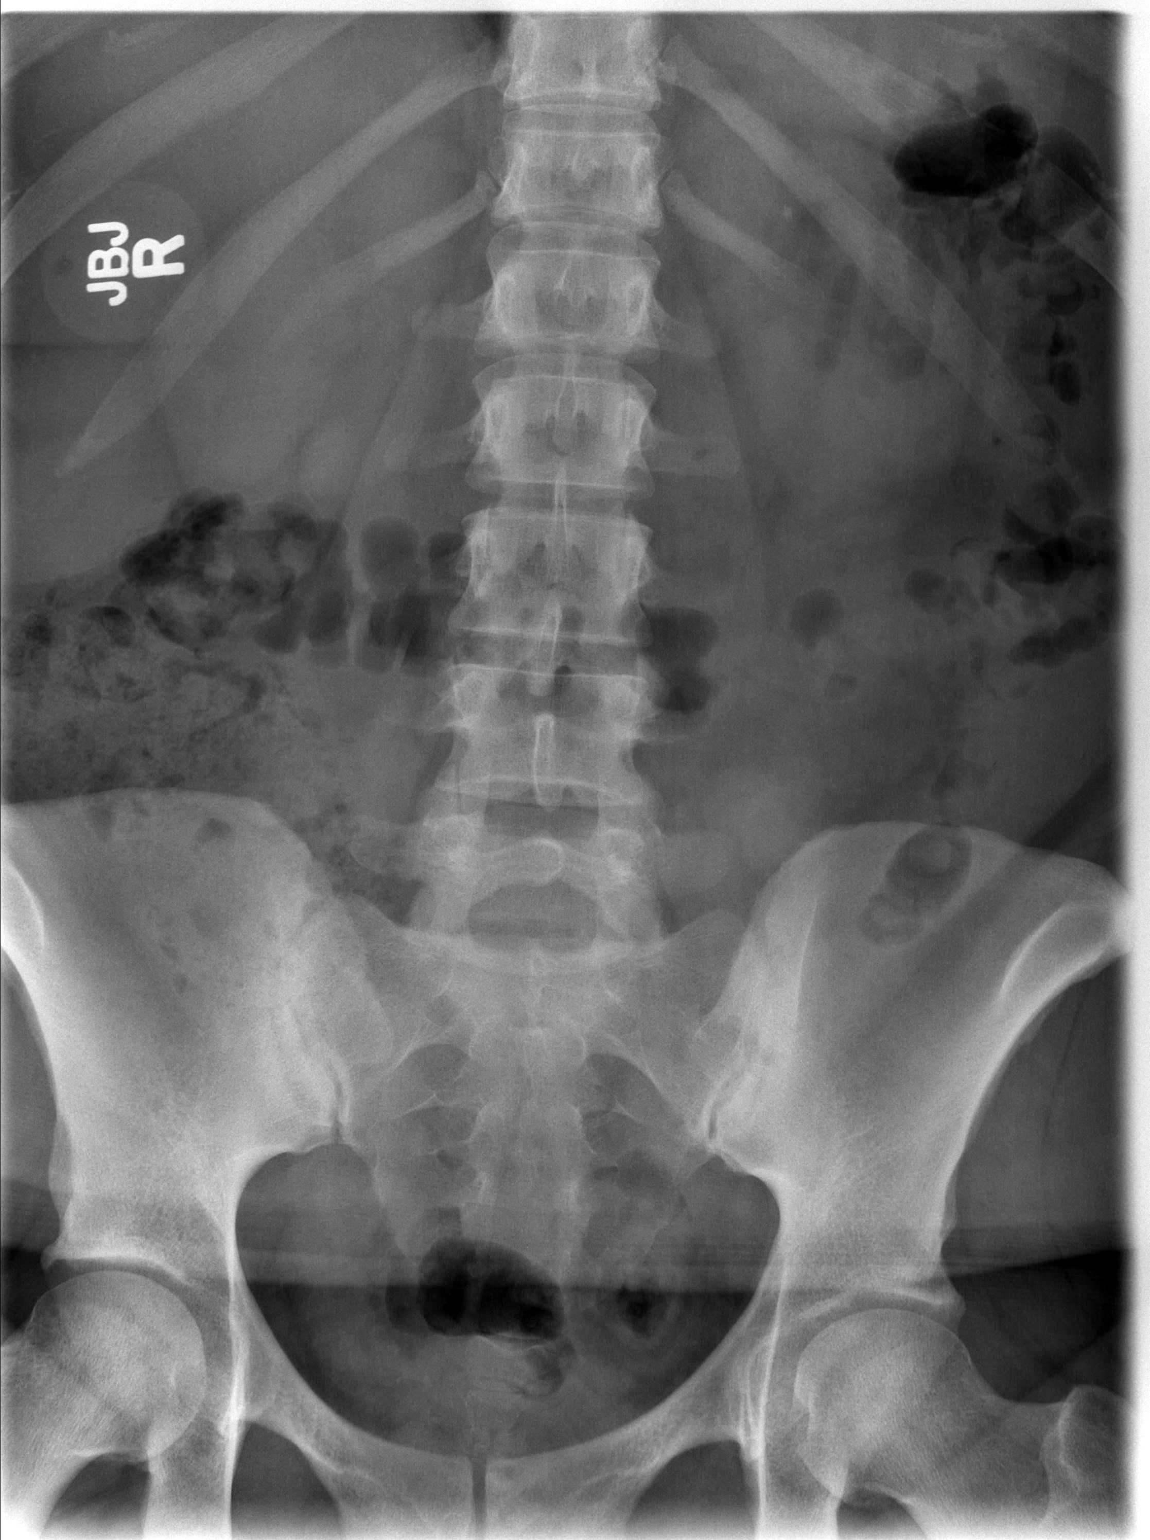

[t l-spine lat]
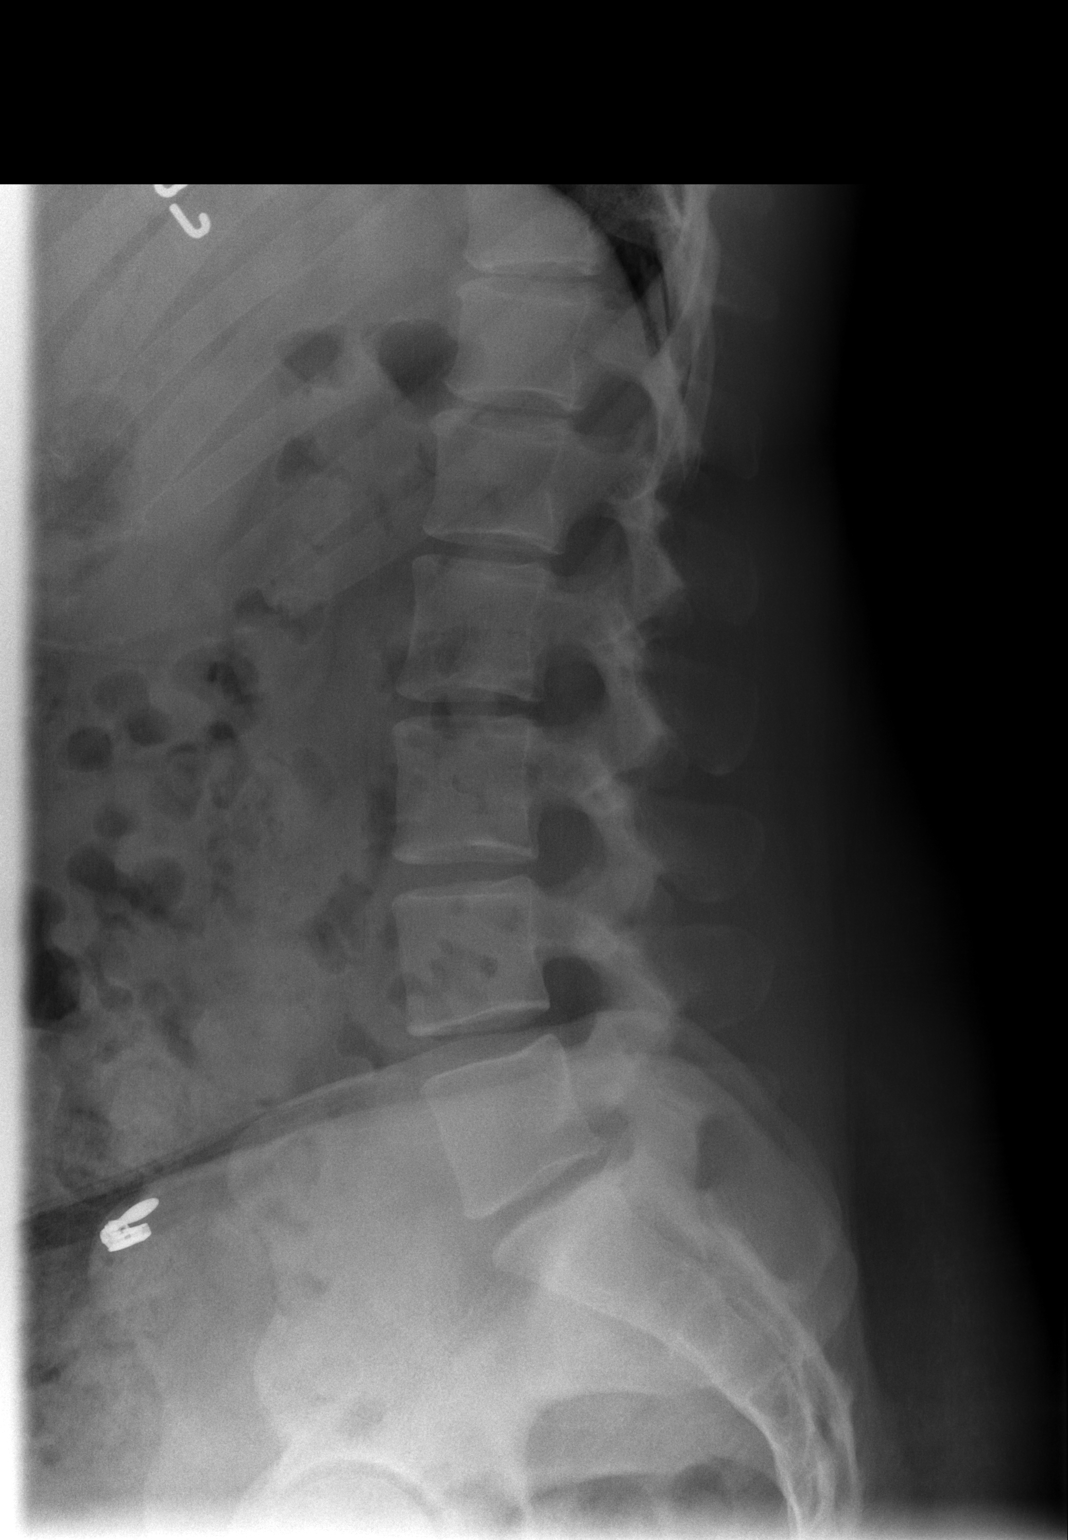

[2 of 2 positions shown; findings below may reference images not displayed]

FINDINGS: Five lumbar type vertebral bodies.  Mild sclerosis of the
sacroiliac joints is likely degenerative.  3mm  upper pole left
renal calculus.

Mild loss of intervertebral disc height at the lumbosacral
junction.  Maintenance of vertebral body height.
IMPRESSION: 1. Degenerative disc disease at the lumbosacral junction.
2.  Left renal calculus, similar to on prior CT.

## 2013-08-06 ENCOUNTER — Telehealth: Payer: Self-pay

## 2013-08-06 NOTE — Telephone Encounter (Signed)
Patient said she went to physical therapy and she was only approved for one visit. Rehab said it was because her surgery was over a year ago. She wants to know what she needs to do?

## 2013-08-07 NOTE — Telephone Encounter (Signed)
[facility], but considering that she doesn't have insurance, her options are likely limited unfortunately. Would pursue exercises as directed by PT at home.

## 2013-08-11 IMAGING — CR DG SPINE 1V PORT
1 series · 1 of 1 positions shown · non-contrast
Comparison: Preoperative 01/15/2012 exam.

CLINICAL DATA: L5-S1 surgery.

PORTABLE SPINE - 1 VIEW

[lateral]
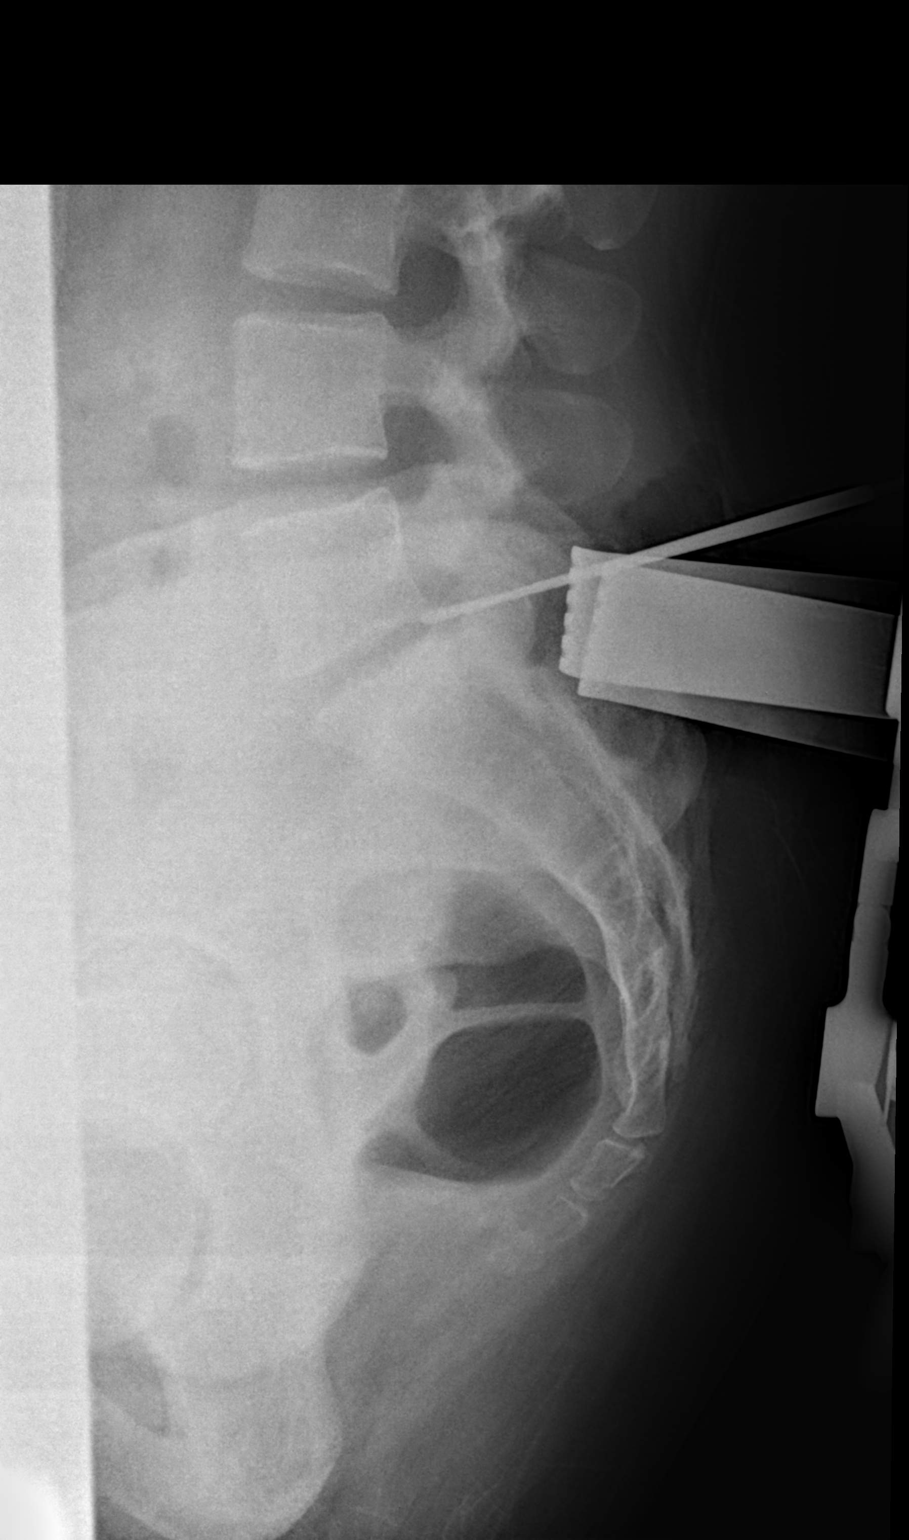

[1 of 1 positions shown; findings below may reference images not displayed]

FINDINGS: Last fully open disc space has been labeled L5-S1.

Single intraoperative lateral view of the lumbar spine reveals
metallic probe at the L5-S1 level.
IMPRESSION: Localization L5-S1.

## 2013-08-12 NOTE — Telephone Encounter (Signed)
Tried to contact patient, phone got disconnected.

## 2013-08-13 ENCOUNTER — Telehealth: Payer: Self-pay

## 2013-08-13 NOTE — Telephone Encounter (Signed)
Patient returned call regarding her physical therapy.

## 2013-08-14 NOTE — Telephone Encounter (Signed)
Advised patient she would need to contact her insurance regarding physical therapy.  Also reminded patient to continue medication and try to do the exercise suggested.

## 2013-08-25 ENCOUNTER — Encounter: Payer: Medicaid Other | Admitting: Physical Medicine & Rehabilitation

## 2013-08-30 ENCOUNTER — Other Ambulatory Visit: Payer: Self-pay | Admitting: Nurse Practitioner

## 2013-09-02 ENCOUNTER — Other Ambulatory Visit: Payer: Self-pay | Admitting: *Deleted

## 2013-09-02 MED ORDER — FEBUXOSTAT 40 MG PO TABS: 40.0000 mg | ORAL_TABLET | Freq: Every day | ORAL | Status: DC

## 2013-09-02 MED ORDER — ESOMEPRAZOLE MAGNESIUM 40 MG PO CPDR: 40.0000 mg | DELAYED_RELEASE_CAPSULE | Freq: Two times a day (BID) | ORAL | Status: DC

## 2013-09-11 ENCOUNTER — Other Ambulatory Visit: Payer: Self-pay | Admitting: Nurse Practitioner

## 2013-09-11 MED ORDER — BUPROPION HCL ER (XL) 300 MG PO TB24: 300.0000 mg | ORAL_TABLET | Freq: Every day | ORAL | Status: DC

## 2013-09-18 ENCOUNTER — Encounter: Payer: Self-pay | Admitting: Nurse Practitioner

## 2013-09-18 ENCOUNTER — Ambulatory Visit (INDEPENDENT_AMBULATORY_CARE_PROVIDER_SITE_OTHER): Payer: Medicaid Other | Admitting: Nurse Practitioner

## 2013-09-18 ENCOUNTER — Ambulatory Visit: Payer: Medicaid Other | Admitting: Nurse Practitioner

## 2013-09-18 VITALS — BP 129/85 | HR 69 | Temp 98.6°F | Ht 65.0 in | Wt 296.0 lb

## 2013-09-18 DIAGNOSIS — M533 Sacrococcygeal disorders, not elsewhere classified: Secondary | ICD-10-CM

## 2013-09-18 DIAGNOSIS — K219 Gastro-esophageal reflux disease without esophagitis: Secondary | ICD-10-CM

## 2013-09-18 DIAGNOSIS — IMO0001 Reserved for inherently not codable concepts without codable children: Secondary | ICD-10-CM

## 2013-09-18 DIAGNOSIS — E039 Hypothyroidism, unspecified: Secondary | ICD-10-CM

## 2013-09-18 DIAGNOSIS — K589 Irritable bowel syndrome without diarrhea: Secondary | ICD-10-CM

## 2013-09-18 DIAGNOSIS — F411 Generalized anxiety disorder: Secondary | ICD-10-CM

## 2013-09-18 DIAGNOSIS — I10 Essential (primary) hypertension: Secondary | ICD-10-CM

## 2013-09-18 DIAGNOSIS — M109 Gout, unspecified: Secondary | ICD-10-CM

## 2013-09-18 DIAGNOSIS — M7918 Myalgia, other site: Secondary | ICD-10-CM

## 2013-09-18 DIAGNOSIS — M546 Pain in thoracic spine: Secondary | ICD-10-CM

## 2013-09-18 DIAGNOSIS — M542 Cervicalgia: Secondary | ICD-10-CM

## 2013-09-18 DIAGNOSIS — M961 Postlaminectomy syndrome, not elsewhere classified: Secondary | ICD-10-CM

## 2013-09-18 DIAGNOSIS — M1009 Idiopathic gout, multiple sites: Secondary | ICD-10-CM

## 2013-09-18 MED ORDER — TIZANIDINE HCL 2 MG PO CAPS: 2.0000 mg | ORAL_CAPSULE | Freq: Three times a day (TID) | ORAL | Status: AC | PRN

## 2013-09-18 MED ORDER — DICYCLOMINE HCL 20 MG PO TABS: ORAL_TABLET | ORAL | Status: DC

## 2013-09-18 MED ORDER — FEBUXOSTAT 40 MG PO TABS: 40.0000 mg | ORAL_TABLET | Freq: Every day | ORAL | Status: DC

## 2013-09-18 MED ORDER — ESOMEPRAZOLE MAGNESIUM 40 MG PO CPDR: 40.0000 mg | DELAYED_RELEASE_CAPSULE | Freq: Two times a day (BID) | ORAL | Status: DC

## 2013-09-18 MED ORDER — HYDROCHLOROTHIAZIDE 25 MG PO TABS: 25.0000 mg | ORAL_TABLET | Freq: Every day | ORAL | Status: DC

## 2013-09-18 MED ORDER — BUPROPION HCL ER (XL) 300 MG PO TB24: 300.0000 mg | ORAL_TABLET | Freq: Every day | ORAL | Status: DC

## 2013-09-18 MED ORDER — LEVOTHYROXINE SODIUM 100 MCG PO TABS: 100.0000 ug | ORAL_TABLET | Freq: Every day | ORAL | Status: DC

## 2013-09-18 NOTE — Progress Notes (Signed)
Subjective:    Patient ID: Tasha Hays, female    DOB: Jun 01, 1986, 28 y.o.   MRN: [id number]  Patient here today for folow up- sh ehas been doing quite well the last few months. No compliants today.  Hypertension This is a chronic problem. The current episode started more than 1 year ago. The problem is unchanged. The problem is controlled. Associated symptoms include anxiety and headaches. Pertinent negatives include no blurred vision, orthopnea, palpitations, peripheral edema or shortness of breath. There are no associated agents to hypertension. Risk factors for coronary artery disease include obesity, family history, dyslipidemia and sedentary lifestyle. Past treatments include diuretics. The current treatment provides moderate improvement. Compliance problems include diet and exercise.  Hypertensive end-organ damage includes a thyroid problem.  Thyroid Problem Visit type: Hypothyroidism. Patient reports no cold intolerance, depressed mood, diaphoresis, diarrhea, dry skin, hair loss, hoarse voice, menstrual problem, palpitations, visual change, weight gain or weight loss.  Gout No recent flare up- takes uloric daily as needed Chronic Back Pain Sees Dr. Cay Schillings- Nothing much that can be done right now- Cant't take NSAIds due to IBS and patient doesn't like how narcotics make her feel. IBS Bentyl helps- still has constipation and diarrhea. GERD Nexium works but insurance is no longer going to cover- needs something else.  Review of Systems  Constitutional: Negative for weight loss, weight gain and diaphoresis.  HENT: Negative for hoarse voice.   Eyes: Negative for blurred vision.  Respiratory: Negative for shortness of breath.   Cardiovascular: Negative for palpitations and orthopnea.  Gastrointestinal: Negative for diarrhea.  Endocrine: Negative for cold intolerance.  Genitourinary: Negative for menstrual problem.  Neurological: Positive for headaches.       Objective:   Physical Exam  Constitutional: She is oriented to person, place, and time. She appears well-developed and well-nourished.  HENT:  Nose: Nose normal.  Mouth/Throat: Oropharynx is clear and moist.  Eyes: EOM are normal.  Neck: Trachea normal, normal range of motion and full passive range of motion without pain. Neck supple. No JVD present. Carotid bruit is not present. No thyromegaly present.  Cardiovascular: Normal rate, regular rhythm, normal heart sounds and intact distal pulses.  Exam reveals no gallop and no friction rub.   No murmur heard. Pulmonary/Chest: Effort normal and breath sounds normal.  Abdominal: Soft. Bowel sounds are normal. She exhibits no distension and no mass. There is no tenderness.  Musculoskeletal: Normal range of motion.  Decrease ROM of lumbar spine due to pain on flexion and ext.   Lymphadenopathy:    She has no cervical adenopathy.  Neurological: She is alert and oriented to person, place, and time. She has normal reflexes.  Skin: Skin is warm and dry.  Psychiatric: She has a normal mood and affect. Her behavior is normal. Judgment and thought content normal.     BP 129/85  Pulse 69  Temp(Src) 98.6 F (37 C) (Oral)  Ht '5\' 5"'  (1.651 m)  Wt 296 lb (134.265 kg)  BMI 49.26 kg/m2      Assessment & Plan:   1. Hypothyroidism   2. Hypertension   3. GERD (gastroesophageal reflux disease)   4. GAD (generalized anxiety disorder)   5. IBS (irritable bowel syndrome)   6. Myofascial pain   7. Lumbar post-laminectomy syndrome   8. Coccydynia   9. Idiopathic gout of multiple sites   10. Cervicalgia   11. Thoracic spine pain    Orders Placed This Encounter  Procedures  . CMP14+EGFR  . NMR,  lipoprofile  . Thyroid Panel With TSH   Meds ordered this encounter  Medications  . buPROPion (WELLBUTRIN XL) 300 MG 24 hr tablet    Sig: Take 1 tablet (300 mg total) by mouth daily.    Dispense:  30 tablet    Refill:  5    Order Specific Question:  Supervising  Provider    Answer:  Chipper Herb [[id number]]  . dicyclomine (BENTYL) 20 MG tablet    Sig: TAKE ONE TABLET BY MOUTH EVERY 6 HOURS    Dispense:  120 tablet    Refill:  5    Order Specific Question:  Supervising Provider    Answer:  Chipper Herb [[id number]]  . esomeprazole (NEXIUM) 40 MG capsule    Sig: Take 1 capsule (40 mg total) by mouth 2 (two) times daily.    Dispense:  60 capsule    Refill:  5    Order Specific Question:  Supervising Provider    Answer:  Chipper Herb [[id number]]  . febuxostat (ULORIC) 40 MG tablet    Sig: Take 1 tablet (40 mg total) by mouth daily.    Dispense:  30 tablet    Refill:  5    Order Specific Question:  Supervising Provider    Answer:  Chipper Herb [[id number]]  . hydrochlorothiazide (HYDRODIURIL) 25 MG tablet    Sig: Take 1 tablet (25 mg total) by mouth daily.    Dispense:  30 tablet    Refill:  5    Order Specific Question:  Supervising Provider    Answer:  Chipper Herb [[id number]]  . levothyroxine (SYNTHROID, LEVOTHROID) 100 MCG tablet    Sig: Take 1 tablet (100 mcg total) by mouth daily before breakfast.    Dispense:  30 tablet    Refill:  5    Order Specific Question:  Supervising Provider    Answer:  Chipper Herb [[id number]]  . tizanidine (ZANAFLEX) 2 MG capsule    Sig: Take 1 capsule (2 mg total) by mouth 3 (three) times daily as needed for muscle spasms.    Dispense:  60 capsule    Refill:  5    Order Specific Question:  Supervising Provider    Answer:  Chipper Herb [place] pending Health maintenance reviewed Diet and exercise encouraged Continue all meds Follow up  In 6 months   [place], FNP

## 2013-09-18 NOTE — Patient Instructions (Signed)

## 2013-09-20 LAB — CMP14+EGFR
ALT: 26 IU/L (ref 0–32)
AST: 21 IU/L (ref 0–40)
Albumin/Globulin Ratio: 1.4 (ref 1.1–2.5)
Albumin: 4.5 g/dL (ref 3.5–5.5)
Alkaline Phosphatase: 64 IU/L (ref 39–117)
BILIRUBIN TOTAL: 0.3 mg/dL (ref 0.0–1.2)
BUN/Creatinine Ratio: 14 (ref 8–20)
BUN: 12 mg/dL (ref 6–20)
CALCIUM: 9.6 mg/dL (ref 8.7–10.2)
CO2: 19 mmol/L (ref 18–29)
CREATININE: 0.87 mg/dL (ref 0.57–1.00)
Chloride: 101 mmol/L (ref 97–108)
GFR, EST AFRICAN AMERICAN: 104 mL/min/{1.73_m2} (ref >59)
GFR, EST NON AFRICAN AMERICAN: 90 mL/min/{1.73_m2} (ref >59)
GLOBULIN, TOTAL: 3.2 g/dL (ref 1.5–4.5)
Glucose: 74 mg/dL (ref 65–99)
Potassium: 3.9 mmol/L (ref 3.5–5.2)
SODIUM: 140 mmol/L (ref 134–144)
Total Protein: 7.7 g/dL (ref 6.0–8.5)

## 2013-09-20 LAB — THYROID PANEL WITH TSH
Free Thyroxine Index: 2.3 (ref 1.2–4.9)
T3 Uptake Ratio: 24 % (ref 24–39)
T4, Total: 9.7 ug/dL (ref 4.5–12.0)
TSH: 4.27 u[IU]/mL (ref 0.450–4.500)

## 2013-09-20 LAB — NMR, LIPOPROFILE
Cholesterol: 197 mg/dL (ref <200)
HDL CHOLESTEROL BY NMR: 41 mg/dL (ref >=40)
HDL PARTICLE NUMBER: 28.6 umol/L — AB (ref >=30.5)
LDL Particle Number: 1634 nmol/L — ABNORMAL HIGH (ref <1000)
LDL Size: 20.8 nm (ref >20.5)
LDLC SERPL CALC-MCNC: 135 mg/dL — ABNORMAL HIGH (ref <100)
LP-IR Score: 44 (ref <=45)
SMALL LDL PARTICLE NUMBER: 563 nmol/L — AB (ref <=527)
Triglycerides by NMR: 106 mg/dL (ref <150)

## 2013-09-21 ENCOUNTER — Telehealth: Payer: Self-pay | Admitting: Nurse Practitioner

## 2013-09-21 NOTE — Telephone Encounter (Signed)
Pt notified and she says she does not want BP med only diuretic  Send to [facility] [place]  Call let her know what was done.

## 2013-09-21 NOTE — Telephone Encounter (Signed)
HCTZ does not pul out peripheral fluid- would have to change to lasix and put you on somethng else for high blood pressure. Is that what you would like to do.

## 2013-09-21 NOTE — Telephone Encounter (Signed)
Spoke with pt and is concerned with her legs feet and ankles that swell every day. She say MMM saw her legs on Friday and the "indentions on her legs.  Wants to know what should she do?

## 2013-09-21 NOTE — Telephone Encounter (Signed)
Blood pressure is normal- should not need to increase dose.

## 2013-09-22 NOTE — Telephone Encounter (Signed)
Can't take HCTZ and lasix both- so ifwe stop hctz we will have to replace it with something else for blood pressure.

## 2013-09-22 NOTE — Telephone Encounter (Signed)
According to chart you are on HVCTZ for hypertension- it does not work for peripheral edema or fluid retention- that is a dieyretic for hyertension. If we switch to lasix YOU WILL HAVE TO GO ON SOMETHING ELSE FOR BLOOD PRESSURE!

## 2013-09-22 NOTE — Telephone Encounter (Signed)
Patient thought she was only hctz for a diuertic only and that she didn't realize she was taking HCTZ for elevated BP and she is ok with stopping HCTZ and doing Lasix. Patient said you could send her a message on mychart

## 2013-09-23 MED ORDER — FUROSEMIDE 20 MG PO TABS: ORAL_TABLET | ORAL | Status: AC

## 2013-09-23 MED ORDER — LISINOPRIL 10 MG PO TABS: 10.0000 mg | ORAL_TABLET | Freq: Every day | ORAL | Status: AC

## 2013-09-23 NOTE — Telephone Encounter (Signed)
Patient is willing to do blood pressure medicine also with the lasix

## 2013-09-23 NOTE — Telephone Encounter (Signed)
Patient aware.

## 2013-09-23 NOTE — Telephone Encounter (Signed)
rx sent to pharmacy Lisinopril 10mg  daily and lasix 20 mg 1/2-1 po daily

## 2013-10-07 ENCOUNTER — Encounter: Payer: Self-pay | Admitting: Nurse Practitioner

## 2013-10-07 ENCOUNTER — Other Ambulatory Visit: Payer: Self-pay | Admitting: Nurse Practitioner

## 2013-10-07 DIAGNOSIS — K219 Gastro-esophageal reflux disease without esophagitis: Secondary | ICD-10-CM

## 2013-10-07 DIAGNOSIS — E039 Hypothyroidism, unspecified: Secondary | ICD-10-CM

## 2013-10-07 DIAGNOSIS — M109 Gout, unspecified: Secondary | ICD-10-CM

## 2013-10-07 DIAGNOSIS — K589 Irritable bowel syndrome without diarrhea: Secondary | ICD-10-CM

## 2013-10-07 MED ORDER — FEBUXOSTAT 40 MG PO TABS: 40.0000 mg | ORAL_TABLET | Freq: Every day | ORAL | Status: AC

## 2013-10-07 MED ORDER — LEVOTHYROXINE SODIUM 100 MCG PO TABS: 100.0000 ug | ORAL_TABLET | Freq: Every day | ORAL | Status: DC

## 2013-10-07 MED ORDER — ESOMEPRAZOLE MAGNESIUM 40 MG PO CPDR: 40.0000 mg | DELAYED_RELEASE_CAPSULE | Freq: Two times a day (BID) | ORAL | Status: AC

## 2013-10-07 MED ORDER — DICYCLOMINE HCL 20 MG PO TABS: ORAL_TABLET | ORAL | Status: AC

## 2013-12-11 ENCOUNTER — Other Ambulatory Visit: Payer: Self-pay | Admitting: Nurse Practitioner

## 2013-12-11 MED ORDER — BUPROPION HCL ER (XL) 300 MG PO TB24: 300.0000 mg | ORAL_TABLET | Freq: Every day | ORAL | Status: AC

## 2014-03-30 ENCOUNTER — Telehealth: Payer: Self-pay | Admitting: Family Medicine

## 2014-03-30 MED ORDER — LEVOTHYROXINE SODIUM 100 MCG PO TABS: 100.0000 ug | ORAL_TABLET | Freq: Every day | ORAL | Status: AC

## 2014-03-30 NOTE — Telephone Encounter (Signed)
Ok to refill levothyroxine find out where she wants you to send it to.

## 2014-03-30 NOTE — Telephone Encounter (Signed)
Patient aware rx sent to pharmacy.  

## 2014-03-30 NOTE — Telephone Encounter (Signed)
Patient wants to know if we can refill levothyroxine until she can get doctor in [place]. She has no insurance
